# Patient Record
Sex: Male | Born: 1945 | Race: White | Hispanic: No | Marital: Married | Smoking: Former smoker
Health system: Southern US, Community
[De-identification: ages and names within clinical notes are randomized; demographics above are authoritative.]

## PROBLEM LIST (undated history)

## (undated) DIAGNOSIS — A809 Acute poliomyelitis, unspecified: Secondary | ICD-10-CM

## (undated) DIAGNOSIS — K219 Gastro-esophageal reflux disease without esophagitis: Secondary | ICD-10-CM

## (undated) DIAGNOSIS — IMO0001 Reserved for inherently not codable concepts without codable children: Secondary | ICD-10-CM

## (undated) DIAGNOSIS — C801 Malignant (primary) neoplasm, unspecified: Secondary | ICD-10-CM

---

## 2014-12-06 ENCOUNTER — Ambulatory Visit
Admission: RE | Admit: 2014-12-06 | Discharge: 2014-12-06 | Disposition: A | Discharge status: Home / Self Care | Payer: Self-pay | Source: Ambulatory Visit | Attending: Family Medicine | Admitting: Family Medicine

## 2014-12-06 ENCOUNTER — Other Ambulatory Visit: Payer: Self-pay | Admitting: Family Medicine

## 2014-12-06 DIAGNOSIS — R222 Localized swelling, mass and lump, trunk: Secondary | ICD-10-CM

## 2014-12-06 DIAGNOSIS — M7541 Impingement syndrome of right shoulder: Secondary | ICD-10-CM

## 2014-12-06 DIAGNOSIS — R52 Pain, unspecified: Secondary | ICD-10-CM

## 2014-12-11 ENCOUNTER — Other Ambulatory Visit: Payer: Medicare Other

## 2014-12-12 ENCOUNTER — Ambulatory Visit
Admission: RE | Admit: 2014-12-12 | Discharge: 2014-12-12 | Disposition: A | Discharge status: Home / Self Care | Payer: Medicare Other | Source: Ambulatory Visit | Attending: Family Medicine | Admitting: Family Medicine

## 2014-12-12 DIAGNOSIS — R222 Localized swelling, mass and lump, trunk: Secondary | ICD-10-CM

## 2014-12-12 MED ORDER — IOPAMIDOL (ISOVUE-300) INJECTION 61%
75.0000 mL | Freq: Once | INTRAVENOUS | Status: AC | PRN
  Administered 2014-12-12: 75 mL via INTRAVENOUS

## 2014-12-13 ENCOUNTER — Telehealth: Payer: Self-pay | Admitting: *Deleted

## 2014-12-13 ENCOUNTER — Encounter: Payer: Self-pay | Admitting: *Deleted

## 2014-12-13 DIAGNOSIS — R918 Other nonspecific abnormal finding of lung field: Secondary | ICD-10-CM

## 2014-12-13 NOTE — Telephone Encounter (Signed)
Called patient to schedule for thoracic clinic.  He is scheduled 12/20/2014 arrive at 1:30

## 2014-12-16 ENCOUNTER — Telehealth: Payer: Self-pay | Admitting: *Deleted

## 2014-12-16 NOTE — Telephone Encounter (Signed)
Called the referring office to get more records.  They asked that I call the son with appt.  I called and left a vm message regarding appt and my name and phone number to call with questions.

## 2014-12-17 ENCOUNTER — Encounter: Payer: Self-pay | Admitting: *Deleted

## 2014-12-19 ENCOUNTER — Other Ambulatory Visit (HOSPITAL_BASED_OUTPATIENT_CLINIC_OR_DEPARTMENT_OTHER): Payer: Medicare Other

## 2014-12-19 ENCOUNTER — Inpatient Hospital Stay (HOSPITAL_COMMUNITY): Payer: Medicare Other

## 2014-12-19 ENCOUNTER — Ambulatory Visit
Admission: RE | Admit: 2014-12-19 | Discharge: 2014-12-19 | Disposition: A | Discharge status: Home / Self Care | Payer: Medicare Other | Source: Ambulatory Visit | Attending: Radiation Oncology | Admitting: Radiation Oncology

## 2014-12-19 ENCOUNTER — Ambulatory Visit: Payer: Medicare Other | Admitting: Physical Therapy

## 2014-12-19 ENCOUNTER — Inpatient Hospital Stay (HOSPITAL_COMMUNITY)
Admission: AD | Admit: 2014-12-19 | Discharge: 2015-01-08 | DRG: 853 | Disposition: E | Payer: Medicare Other | Source: Ambulatory Visit | Attending: Internal Medicine | Admitting: Internal Medicine

## 2014-12-19 ENCOUNTER — Ambulatory Visit (HOSPITAL_BASED_OUTPATIENT_CLINIC_OR_DEPARTMENT_OTHER): Payer: Medicare Other | Admitting: Internal Medicine

## 2014-12-19 ENCOUNTER — Encounter: Payer: Self-pay | Admitting: *Deleted

## 2014-12-19 ENCOUNTER — Encounter (HOSPITAL_COMMUNITY): Payer: Self-pay

## 2014-12-19 VITALS — BP 108/66 | HR 99 | Temp 98.2°F | Resp 14

## 2014-12-19 DIAGNOSIS — R918 Other nonspecific abnormal finding of lung field: Secondary | ICD-10-CM

## 2014-12-19 DIAGNOSIS — R532 Functional quadriplegia: Secondary | ICD-10-CM | POA: Diagnosis present

## 2014-12-19 DIAGNOSIS — D72829 Elevated white blood cell count, unspecified: Secondary | ICD-10-CM | POA: Diagnosis present

## 2014-12-19 DIAGNOSIS — M898X9 Other specified disorders of bone, unspecified site: Secondary | ICD-10-CM

## 2014-12-19 DIAGNOSIS — C7951 Secondary malignant neoplasm of bone: Secondary | ICD-10-CM | POA: Diagnosis present

## 2014-12-19 DIAGNOSIS — R531 Weakness: Secondary | ICD-10-CM | POA: Diagnosis not present

## 2014-12-19 DIAGNOSIS — E870 Hyperosmolality and hypernatremia: Secondary | ICD-10-CM | POA: Diagnosis present

## 2014-12-19 DIAGNOSIS — E43 Unspecified severe protein-calorie malnutrition: Secondary | ICD-10-CM | POA: Diagnosis present

## 2014-12-19 DIAGNOSIS — R627 Adult failure to thrive: Secondary | ICD-10-CM | POA: Diagnosis present

## 2014-12-19 DIAGNOSIS — C3412 Malignant neoplasm of upper lobe, left bronchus or lung: Secondary | ICD-10-CM | POA: Diagnosis present

## 2014-12-19 DIAGNOSIS — A419 Sepsis, unspecified organism: Secondary | ICD-10-CM | POA: Diagnosis present

## 2014-12-19 DIAGNOSIS — E860 Dehydration: Secondary | ICD-10-CM | POA: Diagnosis present

## 2014-12-19 DIAGNOSIS — J9601 Acute respiratory failure with hypoxia: Secondary | ICD-10-CM | POA: Diagnosis present

## 2014-12-19 DIAGNOSIS — R0600 Dyspnea, unspecified: Secondary | ICD-10-CM | POA: Diagnosis present

## 2014-12-19 DIAGNOSIS — G9340 Encephalopathy, unspecified: Secondary | ICD-10-CM | POA: Diagnosis present

## 2014-12-19 DIAGNOSIS — J449 Chronic obstructive pulmonary disease, unspecified: Secondary | ICD-10-CM | POA: Diagnosis present

## 2014-12-19 DIAGNOSIS — K219 Gastro-esophageal reflux disease without esophagitis: Secondary | ICD-10-CM | POA: Diagnosis present

## 2014-12-19 DIAGNOSIS — C799 Secondary malignant neoplasm of unspecified site: Secondary | ICD-10-CM

## 2014-12-19 DIAGNOSIS — R5383 Other fatigue: Secondary | ICD-10-CM | POA: Diagnosis not present

## 2014-12-19 DIAGNOSIS — J9621 Acute and chronic respiratory failure with hypoxia: Secondary | ICD-10-CM | POA: Diagnosis present

## 2014-12-19 DIAGNOSIS — D75839 Thrombocytosis, unspecified: Secondary | ICD-10-CM | POA: Diagnosis present

## 2014-12-19 DIAGNOSIS — R319 Hematuria, unspecified: Secondary | ICD-10-CM

## 2014-12-19 DIAGNOSIS — F17220 Nicotine dependence, chewing tobacco, uncomplicated: Secondary | ICD-10-CM | POA: Diagnosis present

## 2014-12-19 DIAGNOSIS — N390 Urinary tract infection, site not specified: Secondary | ICD-10-CM | POA: Diagnosis present

## 2014-12-19 DIAGNOSIS — Z66 Do not resuscitate: Secondary | ICD-10-CM | POA: Diagnosis present

## 2014-12-19 DIAGNOSIS — D638 Anemia in other chronic diseases classified elsewhere: Secondary | ICD-10-CM | POA: Diagnosis present

## 2014-12-19 DIAGNOSIS — E876 Hypokalemia: Secondary | ICD-10-CM | POA: Diagnosis present

## 2014-12-19 DIAGNOSIS — I240 Acute coronary thrombosis not resulting in myocardial infarction: Secondary | ICD-10-CM | POA: Diagnosis present

## 2014-12-19 DIAGNOSIS — M25511 Pain in right shoulder: Secondary | ICD-10-CM

## 2014-12-19 DIAGNOSIS — D630 Anemia in neoplastic disease: Secondary | ICD-10-CM | POA: Diagnosis present

## 2014-12-19 DIAGNOSIS — G9520 Unspecified cord compression: Secondary | ICD-10-CM

## 2014-12-19 DIAGNOSIS — M899 Disorder of bone, unspecified: Secondary | ICD-10-CM

## 2014-12-19 DIAGNOSIS — Z8612 Personal history of poliomyelitis: Secondary | ICD-10-CM

## 2014-12-19 DIAGNOSIS — Z6823 Body mass index (BMI) 23.0-23.9, adult: Secondary | ICD-10-CM

## 2014-12-19 DIAGNOSIS — D473 Essential (hemorrhagic) thrombocythemia: Secondary | ICD-10-CM | POA: Diagnosis present

## 2014-12-19 DIAGNOSIS — Z515 Encounter for palliative care: Secondary | ICD-10-CM | POA: Diagnosis not present

## 2014-12-19 DIAGNOSIS — R109 Unspecified abdominal pain: Secondary | ICD-10-CM

## 2014-12-19 DIAGNOSIS — IMO0002 Reserved for concepts with insufficient information to code with codable children: Secondary | ICD-10-CM

## 2014-12-19 HISTORY — DX: Gastro-esophageal reflux disease without esophagitis: K21.9

## 2014-12-19 HISTORY — DX: Acute poliomyelitis, unspecified: A80.9

## 2014-12-19 HISTORY — DX: Reserved for inherently not codable concepts without codable children: IMO0001

## 2014-12-19 HISTORY — DX: Malignant (primary) neoplasm, unspecified: C80.1

## 2014-12-19 LAB — CBC WITH DIFFERENTIAL/PLATELET
BASO%: 0.3 % (ref 0.0–2.0)
BASOS ABS: 0.1 10*3/uL (ref 0.0–0.1)
Basophils Absolute: 0 10*3/uL (ref 0.0–0.1)
Basophils Relative: 0 % (ref 0–1)
EOS ABS: 0.6 10*3/uL — AB (ref 0.0–0.5)
EOS%: 1.4 % (ref 0.0–7.0)
Eosinophils Absolute: 0.4 10*3/uL (ref 0.0–0.7)
Eosinophils Relative: 1 % (ref 0–5)
HCT: 38.3 % — ABNORMAL LOW (ref 39.0–52.0)
HEMATOCRIT: 36.9 % — AB (ref 38.4–49.9)
HEMOGLOBIN: 11.9 g/dL — AB (ref 13.0–17.1)
Hemoglobin: 11.5 g/dL — ABNORMAL LOW (ref 13.0–17.0)
LYMPH#: 1.4 10*3/uL (ref 0.9–3.3)
LYMPH%: 3.5 % — ABNORMAL LOW (ref 14.0–49.0)
LYMPHS ABS: 1.8 10*3/uL (ref 0.7–4.0)
Lymphocytes Relative: 5 % — ABNORMAL LOW (ref 12–46)
MCH: 26.5 pg — ABNORMAL LOW (ref 27.2–33.4)
MCH: 26.1 pg (ref 26.0–34.0)
MCHC: 32.2 g/dL (ref 32.0–36.0)
MCHC: 30 g/dL (ref 30.0–36.0)
MCV: 82.4 fL (ref 79.3–98.0)
MCV: 86.8 fL (ref 78.0–100.0)
MONO#: 2.1 10*3/uL — AB (ref 0.1–0.9)
MONO%: 5.1 % (ref 0.0–14.0)
MONOS PCT: 6 % (ref 3–12)
Monocytes Absolute: 2.2 10*3/uL — ABNORMAL HIGH (ref 0.1–1.0)
NEUT#: 36.5 10*3/uL — ABNORMAL HIGH (ref 1.5–6.5)
NEUT%: 89.7 % — AB (ref 39.0–75.0)
NEUTROS ABS: 31.6 10*3/uL — AB (ref 1.7–7.7)
Neutrophils Relative %: 88 % — ABNORMAL HIGH (ref 43–77)
Platelets: 499 10*3/uL — ABNORMAL HIGH (ref 140–400)
Platelets: 401 10*3/uL — ABNORMAL HIGH (ref 150–400)
RBC: 4.48 10*6/uL (ref 4.20–5.82)
RBC: 4.41 MIL/uL (ref 4.22–5.81)
RDW: 14.6 % (ref 11.0–14.6)
RDW: 14.8 % (ref 11.5–15.5)
WBC: 40.7 10*3/uL — AB (ref 4.0–10.3)
WBC: 36 10*3/uL — AB (ref 4.0–10.5)

## 2014-12-19 LAB — URINALYSIS, ROUTINE W REFLEX MICROSCOPIC
BILIRUBIN URINE: NEGATIVE
Glucose, UA: NEGATIVE mg/dL
Ketones, ur: NEGATIVE mg/dL
Nitrite: NEGATIVE
PH: 6.5 (ref 5.0–8.0)
Protein, ur: 30 mg/dL — AB
SPECIFIC GRAVITY, URINE: 1.013 (ref 1.005–1.030)
Urobilinogen, UA: 1 mg/dL (ref 0.0–1.0)

## 2014-12-19 LAB — COMPREHENSIVE METABOLIC PANEL
ALT: 24 U/L (ref 17–63)
AST: 27 U/L (ref 15–41)
Albumin: 2.7 g/dL — ABNORMAL LOW (ref 3.5–5.0)
Alkaline Phosphatase: 115 U/L (ref 38–126)
Anion gap: 6 (ref 5–15)
BUN: 23 mg/dL — ABNORMAL HIGH (ref 6–20)
CO2: 36 mmol/L — AB (ref 22–32)
CREATININE: 0.59 mg/dL — AB (ref 0.61–1.24)
Calcium: 12.7 mg/dL — ABNORMAL HIGH (ref 8.9–10.3)
Chloride: 94 mmol/L — ABNORMAL LOW (ref 101–111)
GFR calc Af Amer: >60 mL/min (ref >60)
Glucose, Bld: 115 mg/dL — ABNORMAL HIGH (ref 65–99)
Potassium: 3.7 mmol/L (ref 3.5–5.1)
Sodium: 136 mmol/L (ref 135–145)
TOTAL PROTEIN: 7.2 g/dL (ref 6.5–8.1)
Total Bilirubin: 0.7 mg/dL (ref 0.3–1.2)

## 2014-12-19 LAB — COMPREHENSIVE METABOLIC PANEL (CC13)
ALT: 23 U/L (ref 0–55)
ANION GAP: 13 meq/L — AB (ref 3–11)
AST: 34 U/L (ref 5–34)
Albumin: 2.6 g/dL — ABNORMAL LOW (ref 3.5–5.0)
Alkaline Phosphatase: 136 U/L (ref 40–150)
BUN: 22.1 mg/dL (ref 7.0–26.0)
CHLORIDE: 93 meq/L — AB (ref 98–109)
CO2: 33 mEq/L — ABNORMAL HIGH (ref 22–29)
Calcium: 14.3 mg/dL (ref 8.4–10.4)
Creatinine: 0.7 mg/dL (ref 0.7–1.3)
EGFR: >90 mL/min/{1.73_m2} (ref >90)
GLUCOSE: 105 mg/dL (ref 70–140)
Potassium: 3.8 mEq/L (ref 3.5–5.1)
SODIUM: 140 meq/L (ref 136–145)
Total Bilirubin: 0.5 mg/dL (ref 0.20–1.20)
Total Protein: 7.5 g/dL (ref 6.4–8.3)

## 2014-12-19 LAB — RETICULOCYTES
RBC.: 4.41 MIL/uL (ref 4.22–5.81)
Retic Count, Absolute: 70.6 10*3/uL (ref 19.0–186.0)
Retic Ct Pct: 1.6 % (ref 0.4–3.1)

## 2014-12-19 LAB — URINE MICROSCOPIC-ADD ON

## 2014-12-19 LAB — TECHNOLOGIST REVIEW

## 2014-12-19 LAB — PHOSPHORUS: Phosphorus: 3 mg/dL (ref 2.5–4.6)

## 2014-12-19 LAB — APTT: aPTT: 34 seconds (ref 24–37)

## 2014-12-19 LAB — LACTIC ACID, PLASMA: LACTIC ACID, VENOUS: 1.2 mmol/L (ref 0.5–2.0)

## 2014-12-19 LAB — TSH: TSH: 0.565 u[IU]/mL (ref 0.350–4.500)

## 2014-12-19 LAB — HOLD TUBE, BLOOD BANK

## 2014-12-19 LAB — PROTIME-INR
INR: 1.14 (ref 0.00–1.49)
Prothrombin Time: 14.7 seconds (ref 11.6–15.2)

## 2014-12-19 LAB — MAGNESIUM: MAGNESIUM: 1.9 mg/dL (ref 1.7–2.4)

## 2014-12-19 MED ORDER — HYDROCODONE-ACETAMINOPHEN 5-325 MG PO TABS: 1.0000 | ORAL_TABLET | ORAL | Status: DC | PRN

## 2014-12-19 MED ORDER — GUAIFENESIN-DM 100-10 MG/5ML PO SYRP: 5.0000 mL | ORAL_SOLUTION | ORAL | Status: DC | PRN

## 2014-12-19 MED ORDER — MORPHINE SULFATE 2 MG/ML IJ SOLN
1.0000 mg | INTRAMUSCULAR | Status: DC | PRN
  Administered 2014-12-19 – 2014-12-20 (×2): 1 mg via INTRAVENOUS
  Administered 2014-12-20: 0.5 mg via INTRAVENOUS
  Filled 2014-12-19 (×3): qty 1

## 2014-12-19 MED ORDER — ALBUTEROL SULFATE (2.5 MG/3ML) 0.083% IN NEBU: 2.5000 mg | INHALATION_SOLUTION | RESPIRATORY_TRACT | Status: DC | PRN

## 2014-12-19 MED ORDER — LORAZEPAM 2 MG/ML IJ SOLN
0.5000 mg | Freq: Four times a day (QID) | INTRAMUSCULAR | Status: DC | PRN
  Administered 2014-12-19 – 2014-12-20 (×2): 0.5 mg via INTRAVENOUS
  Administered 2014-12-20 – 2014-12-22 (×6): 1 mg via INTRAVENOUS
  Filled 2014-12-19 (×8): qty 1

## 2014-12-19 MED ORDER — ONDANSETRON HCL 4 MG PO TABS: 4.0000 mg | ORAL_TABLET | Freq: Four times a day (QID) | ORAL | Status: DC | PRN

## 2014-12-19 MED ORDER — GUAIFENESIN ER 600 MG PO TB12
600.0000 mg | ORAL_TABLET | Freq: Two times a day (BID) | ORAL | Status: DC
  Filled 2014-12-19 (×9): qty 1

## 2014-12-19 MED ORDER — ALBUTEROL SULFATE (2.5 MG/3ML) 0.083% IN NEBU
2.5000 mg | INHALATION_SOLUTION | Freq: Four times a day (QID) | RESPIRATORY_TRACT | Status: DC
Start: 2014-12-19 — End: 2014-12-20
  Administered 2014-12-19: 2.5 mg via RESPIRATORY_TRACT
  Filled 2014-12-19: qty 3

## 2014-12-19 MED ORDER — SODIUM CHLORIDE 0.9 % IV SOLN
INTRAVENOUS | Status: DC
  Administered 2014-12-19 – 2014-12-20 (×2): 1000 mL via INTRAVENOUS

## 2014-12-19 MED ORDER — ONDANSETRON HCL 4 MG/2ML IJ SOLN: 4.0000 mg | Freq: Four times a day (QID) | INTRAMUSCULAR | Status: DC | PRN

## 2014-12-19 MED ORDER — CEFTRIAXONE SODIUM IN DEXTROSE 20 MG/ML IV SOLN
1.0000 g | INTRAVENOUS | Status: DC
  Administered 2014-12-19 – 2014-12-22 (×4): 1 g via INTRAVENOUS
  Filled 2014-12-19 (×4): qty 50

## 2014-12-19 MED ORDER — ZOLEDRONIC ACID 4 MG/5ML IV CONC
4.0000 mg | Freq: Once | INTRAVENOUS | Status: AC
  Administered 2014-12-19: 4 mg via INTRAVENOUS
  Filled 2014-12-19: qty 5

## 2014-12-19 MED ORDER — ENOXAPARIN SODIUM 40 MG/0.4ML ~~LOC~~ SOLN
40.0000 mg | SUBCUTANEOUS | Status: DC
  Filled 2014-12-19 (×2): qty 0.4

## 2014-12-19 NOTE — H&P (Signed)
[facility] History and Physical  Samuel Fritz EPP:[id number] DOB: 14-Mar-1946 DOA: 12/30/2014  Referring physician: Transfer from Dr. Worthy Flank office  PCP: Rachell Cipro, MD   Chief Complaint: [place]   HPI:  Patient is 69 year old male who has not seen a physician for extended period of time, apparently diagnosed with lung mass week prior to this admission and referred to Dr. Julien Nordmann for further evaluation. Please note that patient is too weak to provide any history at the time of the admission and wife at bedside able to assist with more details. She explains that patient had an appointment with Dr. Julien Nordmann earlier today prior to this admission where he was noted to be severely weak, had difficulty completing sentences, dyspnea at rest and with exertion noted with oxygen saturation in mid 80s on room air. Blood work was notable for WBC 40 K, calcium 14.3. Dr. Julien Nordmann asked to admit patient for further evaluation and management. Per wife, patient has been progressively declining over the past 4 months with > 20 pounds weight loss. Wife has also noted intermittent episodes of confusion, lethargy, poor oral intake. Patient has been consistently complaining to his wife about right shoulder pain, appeared to be constant, 10 out of 10 in severity, radiating to upper and lower back area, worse with movement, no specific alleviating factors, associated with right arm weakness and wife reports noticing that patient has not been moving his right upper extremity over the past week. Wife denies noticing any fevers or chills, no specific abdominal or urinary concerns reported. In the oncologist office, urine with blood noted. TRH asked to admit for further evaluation and management.  Assessment and Plan:  Principal Problem:   Weakness - Appears to be multifactorial and secondary to progressive failure to thrive and deconditioning in the setting of what appears to be metastatic  malignancy - More detailed workup in progress outlined below - Once patient more clinically stable we'll need PT/OT evaluation - We will provide supportive care with analgesia as needed to ensure comfort Active Problems:   Lung mass, secondary to lung cancer  - Workup in progress, plan for biopsy of the right scapular lesion - Appreciate oncology, radiation oncology, interventional radiology teams for assistance in management of this complicated medical condition   Right shoulder pain - Appears to be secondary to metastatic lung cancer - Biopsy of the scapular lesion planned as noted above - Radiation oncologist consulted as well for consideration of palliative radiation - Provide analgesia as needed   Leukocytosis - Appears to be reactive and secondary to underlying metastatic lung cancer - Urinalysis also suggestive of UTI with large leukocytes present - Placed on empiric Rocephin for now - Will obtain blood culture, urine culture - Check lactic acid   Hypercalcemia - Likely secondary to malignancy, IV Zometa requested as recommended by oncologist - also provide IVF and repeat BMP tonight and again in AM   Severe protein-calorie malnutrition - In the setting of illness outlined above - Significant weight loss also related to progression of the metastatic lung cancer - We will advance diet as patient able to tolerate - Nutrition is consulted   ? Cord compression - Significant weakness in right upper extremity, recent CT chest on 12/12/2014 noted worrisome possibility of cord compression given metastatic spread - We'll proceed with MRI of thoracic spine for further evaluation - Radiation oncologist consulted, may need Decadron   Acute encephalopathy - Possibly secondary to metastatic spread of lung cancer - MRI of the brain  requested for further evaluation   Acute respiratory failure with hypoxia - Secondary to lung cancer with likely underlying COPD (pt with long history of  smoking but apparently quit few months ago) - Please note the chest x-ray does not mention COPD however, image notable for hyperinflated lungs with flattening of the diaphragms consistent with stigmata of rather advanced COPD - Keep on oxygen via nasal cannula for now, provide bronchodilators scheduled and as needed   Thrombocytosis - Appears to be reactive, repeat CBC in the morning   Anemia of chronic disease - Anemia of malignancy, no signs of active bleeding, repeat CBC in the morning   DVT prophylaxis - Lovenox SQ   Radiological Exams on Admission: Dg Chest 2 View  12/14/2014   1. Stable findings of recently diagnosed metastatic lung cancer.  2. No acute superimposed process identified.     CT chest 12/12/2014 1. Large centrally necrotic left upper lobe lung mass with multiple bilateral pulmonary nodules and osseous metastases consistent with stage IV lung cancer. 2. Most prominent osseous metastases are within the right scapular body and T8 vertebral body. The latter is associated with epidural tumor, placing the patient at risk for cord compression in the future, not currently demonstrated.    Code Status: Full Family Communication: Pt and wife at bedside Disposition Plan: Admit for further evaluation    [facility] [phone]   Review of Systems:  Constitutional: Negative for diaphoresis.  HENT: Negative for hearing loss, ear pain, neck pain, tinnitus and ear discharge.   Eyes: Negative for blurred vision, double vision, photophobia, pain, discharge and redness.  Respiratory: Negative for hemoptysis and stridor.   Cardiovascular: Negative for  claudication and leg swelling.  Gastrointestinal: Negative for nausea, vomiting and abdominal pain.  Genitourinary: Negative for dysuria, urgency, and flank pain.  Musculoskeletal: Negative for myalgias, and falls.  Skin: Negative for itching and rash.  Neurological: Negative for dizziness Endo/Heme/Allergies: Negative for  environmental allergies and polydipsia. Does not bruise/bleed easily.  Psychiatric/Behavioral: Negative for suicidal ideas.     Past Medical History  Diagnosis Date  . Polio   . GERD (gastroesophageal reflux disease)   . Shortness of breath dyspnea   . Cancer     Lung cancer   Pt denies any surgical history   Social History:  reports that he has quit smoking. His smoking use included Cigarettes. His smokeless tobacco use includes Chew. He reports that he does not drink alcohol or use illicit drugs.  Pt reports no family history of cancers.  Prior to Admission medications   Medication Sig Start Date End Date Taking? Authorizing Provider  ENSURE (ENSURE) Take 1 Can by mouth 2 (two) times daily between meals.    Historical Provider, MD  morphine 10 MG/5ML solution Take 2.5 mg by mouth every 2 (two) hours as needed for severe pain (0.53m-0.5ml q2hrs per family).    Historical Provider, MD  traMADol (Veatrice Bourbon 50 MG tablet  12/14/14   Historical Provider, MD    Physical Exam: There were no vitals filed for this visit.  Physical Exam  Constitutional: Appears tired and chronically ill, frail and cachectic HENT: Normocephalic. External right and left ear normal. Dry MM Eyes: Conjunctivae and EOM are normal. PERRLA, no scleral icterus.  Neck: Normal ROM. Neck supple. No JVD. No tracheal deviation. No thyromegaly.  CVS: RRR, S1/S2 +, no murmurs, no gallops, no carotid bruit.  Pulmonary: Diminished breath sounds bilaterally with expiratory wheezing noted in upper lobes, mild bronchitis at bases  Abdominal: Soft. BS +,  no distension, tenderness, rebound or guarding.  Musculoskeletal: Normal range of motion. significant tenderness in thoracic and lumbar spine and paraspinal area bilaterally Lymphadenopathy: No lymphadenopathy noted, cervical, inguinal. Neuro: Alert. No cranial nerve deficit. Skin: Skin is warm and dry. No rash noted. Not diaphoretic. No erythema. No pallor.  Psychiatric:  Tired appearing, flat affect   Labs on Admission:  Basic Metabolic Panel:  Recent Labs Lab 12/30/2014 1347  NA 140  K 3.8  CO2 33*  GLUCOSE 105  BUN 22.1  CREATININE 0.7  CALCIUM 14.3*   Liver Function Tests:  Recent Labs Lab 12/31/2014 1347  AST 34  ALT 23  ALKPHOS 136  BILITOT 0.50  PROT 7.5  ALBUMIN 2.6*   CBC:  Recent Labs Lab 12/17/2014 1347  WBC 40.7*  NEUTROABS 36.5*  HGB 11.9*  HCT 36.9*  MCV 82.4  PLT 499*    EKG: Pending    If 7PM-7AM, please contact night-coverage [email or web address] Password [email or web address] 12/12/2014, 4:35 PM

## 2014-12-19 NOTE — Progress Notes (Signed)
Received a phone call from Dominica at [facility]. States pt was enrolled in their services last week. Requesting admitting diagnosis and updates. [phone].

## 2014-12-19 NOTE — Progress Notes (Signed)
[place] [phone]   [phone] Multidisciplinary thoracic oncology clinic  CONSULT NOTE  REFERRING PHYSICIAN: Dr. Rachell Cipro  REASON FOR CONSULTATION:  69 years old white male with questionable lung cancer.  HPI Samuel Fritz is a 69 y.o. male with no significant past medical history but long history of smoking and quit 4 years ago. The patient mentions that, weeks ago he presented to his primary care physician complaining of right shoulder pain as well as cough and shortness of breath. He had x-ray of the thoracic spines as well as right shoulder and it showed no evidence of thoracic spine fracture or other acute findings but there was a large masslike opacity in the left upper lobe consistent with bronchogenic carcinoma. Chest x-ray on 12/06/2014 showed 9.6 cm left upper lobe mass anteriorly with adjacent 3.5 cm lingular mass. The appearance was highly concerning for malignancy. CT scan of the chest with contrast on 12/12/2014 showed large centrally necrotic left upper lobe lung mass measuring 7.9 x 6.9 cm. There are several satellite lesions primarily within the left upper lobe measuring 2.1 cm and 4.2 x 2.8 cm. The latter lesion crosses the major fissure and extends into the lower lobe. This is suspicious of transpleural spread of the dominant left upper lobe lesion onto the chest wall. There is osseous metastases and the most prominent are within the right scapular body and T8 vertebral body. The latter is associated with epidural tumor placing the patient at risk for cord compression. The patient was referred to the multidisciplinary thoracic oncology clinic today for further evaluation and recommendation regarding his condition. When seen today he is very weak and sitting in a wheelchair. Has been complaining of lack of appetite, poor by mouth intake and dehydration. He also has constipation and was treated with an enema few days ago. He has  increased urine frequency with hematuria started yesterday. The patient also had significant weight loss recently. He denied having any fever or chills. He has no nausea or vomiting. He continues to have right-sided chest pain with shortness breath with exertion but no cough or hemoptysis. He has no headache or visual changes. Family history significant for a mother with throat cancer. Father had heart attack. Brother with brain cancer, another brother with lip cancer and prostate cancer. The patient is married and has 2 children. He was accompanied today by his wife, Samuel Fritz, his son, Samuel Fritz. and his daughter Samuel Fritz. The patient used to work in [employer]. He has a history of smoking more than one pack per day for over 50 years and quit 4 years ago. He also recently switch it to chewing tobacco. He has no history of alcohol or drug abuse. HPI  Past Medical History  Diagnosis Date  . Hypertension     No past surgical history on file.  No family history on file.  Social History History  Substance Use Topics  . Smoking status: Current Every Day Smoker  . Smokeless tobacco: Not on file  . Alcohol Use: No    Allergies not on file  Current Outpatient Prescriptions  Medication Sig Dispense Refill  . ENSURE (ENSURE) Take 1 Can by mouth 2 (two) times daily between meals.    Marland Kitchen morphine 10 MG/5ML solution Take 2.5 mg by mouth every 2 (two) hours as needed for severe pain (0.61m-0.5ml q2hrs per family).    . traMADol (ULTRAM) 50 MG tablet   0   No current facility-administered medications  for this visit.    Review of Systems  Constitutional: positive for anorexia, fatigue and weight loss Eyes: negative Ears, nose, mouth, throat, and face: negative Respiratory: positive for dyspnea on exertion and pleurisy/chest pain Cardiovascular: negative Gastrointestinal: positive for constipation Genitourinary:positive for frequency, hematuria and urinary incontinence Integument/breast:  negative Hematologic/lymphatic: negative Musculoskeletal:positive for bone pain and muscle weakness Neurological: positive for coordination problems, dizziness, memory problems and weakness Behavioral/Psych: negative Endocrine: negative Allergic/Immunologic: negative  Physical Exam  GMW:NUUVOZDGU, ill looking, malnourished and pale SKIN: skin color, texture, turgor are normal HEAD: Normocephalic, No masses, lesions, tenderness or abnormalities EYES: normal, PERRLA EARS: External ears normal, Canals clear OROPHARYNX:no exudate, no erythema and lips, buccal mucosa, and tongue normal  NECK: supple, no adenopathy, no JVD LYMPH:  no palpable lymphadenopathy, no hepatosplenomegaly BREAST:not examined LUNGS: clear to auscultation , and palpation HEART: regular rate & rhythm, no murmurs and no gallops ABDOMEN:abdomen soft, non-tender, normal bowel sounds and no masses or organomegaly BACK: Back symmetric, no curvature., No CVA tenderness EXTREMITIES:no joint deformities, effusion, or inflammation, no edema, no skin discoloration  NEURO: alert & oriented x 3 with fluent speech, no focal motor/sensory deficits  PERFORMANCE STATUS: ECOG 2  LABORATORY DATA: Lab Results  Component Value Date   WBC 40.7* 01/02/2015   HGB 11.9* 12/16/2014   HCT 36.9* 12/31/2014   MCV 82.4 12/15/2014   PLT 499* 12/16/2014      Chemistry      Component Value Date/Time   NA 140 12/22/2014 1347   K 3.8 12/27/2014 1347   CO2 33* 12/16/2014 1347   BUN 22.1 12/08/2014 1347   CREATININE 0.7 12/11/2014 1347      Component Value Date/Time   CALCIUM 14.3* 12/30/2014 1347   ALKPHOS 136 01/03/2015 1347   AST 34 12/18/2014 1347   ALT 23 01/04/2015 1347   BILITOT 0.50 01/01/2015 1347       RADIOGRAPHIC STUDIES: Dg Chest 2 View  12/06/2014   CLINICAL DATA:  Chest pain.  Cough.  Shortness of breath.  EXAM: CHEST  2 VIEW  COMPARISON:  None.  FINDINGS: 9.2 by 9.6 cm left mid lung mass appears to be  anteriorly located in the left upper lobe. Adjacent lingular 3.5 by 2.7 cm mass. Both of these may above the pleural margin, but I do not see obvious bony destruction.  Atherosclerotic aortic arch. Upper normal heart size. The right lung appears clear. Thoracic spondylosis is present.  IMPRESSION: 1. 9.6 cm left upper lobe mass anteriorly. Adjacent 3.5 cm lingular mass. Appearance highly concerning for malignancy. Chest CT (with contrast if feasible) is recommended. These results will be called to the ordering clinician or representative by the Radiologist Assistant, and communication documented in the PACS or zVision Dashboard.   Electronically Signed   By: Van Clines M.D.   On: 12/06/2014 16:42   Dg Thoracic Spine W/swimmers  12/06/2014   CLINICAL DATA:  Chronic thoracic back pain.  No known injury.  EXAM: THORACIC SPINE - 2 VIEW + SWIMMERS  COMPARISON:  None.  FINDINGS: There is no evidence of thoracic spine fracture. Alignment is normal. Thoracic syndesmophyte formation seen with preservation of intervertebral disc spaces, consistent with diffuse idiopathic skeletal hyperostosis. No focal lytic or sclerotic bone lesions identified.  Enlarged masslike opacity is seen in the left upper lobe, suspicious for bronchogenic carcinoma.  IMPRESSION: No evidence of thoracic spine fracture or other acute findings. Diffuse idiopathic skeletal hyperostosis noted.  Large masslike opacity in the left upper lobe, consistent with  bronchogenic carcinoma. Chest CT with contrast recommended for further evaluation.   Electronically Signed   By: Earle Gell M.D.   On: 12/06/2014 16:42   Dg Lumbar Spine Complete  12/06/2014   CLINICAL DATA:  Chronic low back pain.  No known injury.  EXAM: LUMBAR SPINE - COMPLETE 4+ VIEW  COMPARISON:  None.  FINDINGS: Moderate rightward thoracolumbar scoliosis centered at the T12 level. Diffuse degenerative spurring throughout the lumbar spine. Diffuse degenerative facet disease. No  fracture.  IMPRESSION: Rightward scoliosis with diffuse degenerative disc and facet disease. No acute findings.   Electronically Signed   By: Rolm Baptise M.D.   On: 12/06/2014 16:41   Dg Shoulder Right  12/06/2014   CLINICAL DATA:  Right shoulder pain. Right shoulder impingement. Limited range of motion.  EXAM: RIGHT SHOULDER - 2+ VIEW  COMPARISON:  None.  FINDINGS: There is no evidence of fracture or dislocation. Moderate acromioclavicular degenerative spurring and sclerosis is seen. Glenohumeral joint is not well profiled on this exam. Generalized osteopenia noted. No other bone abnormality identified.  IMPRESSION: No acute findings.  Moderate acromioclavicular DJD.   Electronically Signed   By: Earle Gell M.D.   On: 12/06/2014 16:40   Ct Chest W Contrast  12/12/2014   CLINICAL DATA:  Former smoker with left lung mass on radiographs no history of malignancy. Initial encounter.  EXAM: CT CHEST WITH CONTRAST  TECHNIQUE: Multidetector CT imaging of the chest was performed during intravenous contrast administration.  CONTRAST:  75 ml Isovue-300.  COMPARISON:  Chest radiographs 12/06/2014.  FINDINGS: Mediastinum/Nodes: There is a 1.5 cm AP window node on image 24. There are probable enlarged left hilar lymph nodes, not optimally evaluated due to limited contrast bolus. No other enlarged mediastinal lymph nodes demonstrated. The thyroid gland, trachea and esophagus demonstrate no significant findings. The heart size is normal. There is no pericardial effusion.There is diffuse atherosclerosis of the aorta, great vessels and coronary arteries.  Lungs/Pleura: There is no pleural effusion. As demonstrated radiographically, there is a large centrally necrotic left upper lobe mass, measuring 7.9 x 6.9 cm transverse. There are several satellite lesions, primarily within the left upper lobe, measuring 2.1 cm on image 24, 1.7 cm on image 29 and 4.2 x 2.8 cm on image 34. This latter lesion crosses the major fissure and  extends into the lower lobe. There is suspicion of transpleural spread of the dominant left upper lobe lesion into the chest wall. There is no adjacent rib destruction. Multiple other smaller pulmonary nodules are present bilaterally. The largest nodule in the right lung is in the lower lobe, measuring 8 mm on image 30. Moderate diffuse changes of centrilobular emphysema noted.  Upper abdomen:  Unremarkable.  There is no adrenal mass.  Musculoskeletal/Chest wall: There is a large lytic lesion involving the right scapular body, with adjacent soft tissue components measuring up to 6.5 cm on image 13. There is a large lytic lesion which nearly completely replaces the T8 vertebral body. There is mild associated posterior epidural tumor and superior extension into the inferior aspect of the left T7 vertebral body. There is a possible small lytic metastasis within the C7 vertebral body.  IMPRESSION: 1. Large centrally necrotic left upper lobe lung mass with multiple bilateral pulmonary nodules and osseous metastases consistent with stage IV lung cancer. 2. Most prominent osseous metastases are within the right scapular body and T8 vertebral body. The latter is associated with epidural tumor, placing the patient at risk for cord compression in  the future, not currently demonstrated. 3. These results will be called to the ordering clinician or representative by the Radiology Department at the imaging location.   Electronically Signed   By: Richardean Sale M.D.   On: 12/12/2014 13:26    ASSESSMENT: This is a very pleasant 69 years old white male with highly suspicious metastatic lung cancer pending tissue diagnosis and staging workup. The patient presented with large lesions in the left upper lobe as well as satellite lesions and metastatic disease in the right scapular area. His blood work today showed significant leukocytosis concerning for sepsis as well as hypercalcemia of malignancy. The patient is so weak and  confused   PLAN: I had a lengthy discussion with the patient and his family today about his current disease status, further investigation to confirm his diagnosis as well as treatment options. I explained to the patient and his family that he will need to biopsy of the right scapular area for confirmation of his tissue diagnosis. We will also arrange for the patient to have MRI of the brain as well as CT scan of the abdomen and pelvis for staging of his disease. For the confusion and hypercalcemia of malignancy, the patient will need IV hydration as well as treatment with Zometa. He will be admitted to [facility] for further evaluation and management of his condition. For the leukocytosis and questionable sepsis, the patient will have urinalysis as well as culture sensitivity and blood cultures and treatment with antibiotics once he is hospitalized. For the questionable cord compression, the patient will need to start treatment with dexamethasone and has MRI of the thoracic spine to rule out cord compression. He may also need evaluation by radiation oncology. I will continue to follow up the patient during his hospitalization and provide further recommendation as needed. The patient and his family agreed to the current plan. The patient voices understanding of current disease status and treatment options and is in agreement with the current care plan.  All questions were answered. The patient knows to call the clinic with any problems, questions or concerns. We can certainly see the patient much sooner if necessary.  Thank you so much for allowing me to participate in the care of Samuel Fritz. I will continue to follow up the patient with you and assist in his care.  I spent 40 minutes counseling the patient face to face. The total time spent in the appointment was 60 minutes.  Disclaimer: This note was dictated with voice recognition software. Similar sounding words can inadvertently  be transcribed and may not be corrected upon review.   Adisa Litt K. Dec 19, 2014, 2:47 PM

## 2014-12-19 NOTE — Progress Notes (Signed)
Samuel Fritz Clinical Social Work  Clinical Social Work met with patient/family at [employer] appointment to offer support and assess for psychosocial needs.  [name] was accompanied by several family members today.  Medical oncologist met with patient prior to CSW and determined patient would need to be admitted.  Patient's son shared patient is currently enrolled in [facility], [place] encouraged patient's family to keep in communication with Hospice through patient's hospital admission.  Clinical Social Work briefly discussed Clinical Social Work role and [employer] support programs/services.  Clinical Social Work encouraged patient to call with any additional questions or concerns.   Polo Riley, MSW, LCSW, OSW-C Clinical Social Worker [facility] [phone]

## 2014-12-19 NOTE — Progress Notes (Signed)
Patient ID: Samuel Fritz, male   DOB: 1946/02/08, 69 y.o.   MRN: [id number] Request received for image guided biopsy on patient. Patient's case was discussed in tumor board with Dr. Earlie Server and Dr. Barbie Banner. Decision made to perform biopsy of the right scapula lesion. Will assess /tent schedule for procedure on 5/13.

## 2014-12-19 NOTE — Progress Notes (Signed)
Spoke with patient today during thoracic clinic.  Patient is unable to hold his head up or complete a sentence.  Dr. Julien Nordmann is aware of abnormal lab work.  Dr. Julien Nordmann called hospital ist and patient was admitted.  I introduced myself to the family.  I received FMLA papers and they were given to [place].

## 2014-12-20 ENCOUNTER — Inpatient Hospital Stay (HOSPITAL_COMMUNITY): Payer: Medicare Other

## 2014-12-20 ENCOUNTER — Encounter: Payer: Self-pay | Admitting: *Deleted

## 2014-12-20 ENCOUNTER — Encounter: Payer: Self-pay | Admitting: Internal Medicine

## 2014-12-20 DIAGNOSIS — C7951 Secondary malignant neoplasm of bone: Secondary | ICD-10-CM | POA: Insufficient documentation

## 2014-12-20 DIAGNOSIS — R5383 Other fatigue: Secondary | ICD-10-CM

## 2014-12-20 DIAGNOSIS — E860 Dehydration: Secondary | ICD-10-CM

## 2014-12-20 DIAGNOSIS — D72829 Elevated white blood cell count, unspecified: Secondary | ICD-10-CM

## 2014-12-20 DIAGNOSIS — R531 Weakness: Secondary | ICD-10-CM

## 2014-12-20 DIAGNOSIS — R930 Abnormal findings on diagnostic imaging of skull and head, not elsewhere classified: Secondary | ICD-10-CM

## 2014-12-20 LAB — BASIC METABOLIC PANEL
Anion gap: 11 (ref 5–15)
BUN: 21 mg/dL — ABNORMAL HIGH (ref 6–20)
CO2: 34 mmol/L — ABNORMAL HIGH (ref 22–32)
Calcium: 13 mg/dL — ABNORMAL HIGH (ref 8.9–10.3)
Chloride: 95 mmol/L — ABNORMAL LOW (ref 101–111)
Creatinine, Ser: 0.6 mg/dL — ABNORMAL LOW (ref 0.61–1.24)
GFR calc Af Amer: >60 mL/min (ref >60)
Glucose, Bld: 110 mg/dL — ABNORMAL HIGH (ref 65–99)
POTASSIUM: 3.2 mmol/L — AB (ref 3.5–5.1)
SODIUM: 140 mmol/L (ref 135–145)

## 2014-12-20 LAB — IRON AND TIBC
Iron: 23 ug/dL — ABNORMAL LOW (ref 45–182)
Saturation Ratios: 12 % — ABNORMAL LOW (ref 17.9–39.5)
TIBC: 197 ug/dL — ABNORMAL LOW (ref 250–450)
UIBC: 174 ug/dL

## 2014-12-20 LAB — CBC
HEMATOCRIT: 38.9 % — AB (ref 39.0–52.0)
Hemoglobin: 12 g/dL — ABNORMAL LOW (ref 13.0–17.0)
MCH: 26.8 pg (ref 26.0–34.0)
MCHC: 30.8 g/dL (ref 30.0–36.0)
MCV: 86.8 fL (ref 78.0–100.0)
Platelets: 317 10*3/uL (ref 150–400)
RBC: 4.48 MIL/uL (ref 4.22–5.81)
RDW: 14.8 % (ref 11.5–15.5)
WBC: 32.7 10*3/uL — ABNORMAL HIGH (ref 4.0–10.5)

## 2014-12-20 LAB — URINE CULTURE
COLONY COUNT: NO GROWTH
Culture: NO GROWTH

## 2014-12-20 LAB — VITAMIN B12: Vitamin B-12: 290 pg/mL (ref 180–914)

## 2014-12-20 LAB — FOLATE

## 2014-12-20 LAB — FERRITIN: Ferritin: 1094 ng/mL — ABNORMAL HIGH (ref 24–336)

## 2014-12-20 MED ORDER — MIDAZOLAM HCL 2 MG/2ML IJ SOLN
INTRAMUSCULAR | Status: AC | PRN
  Administered 2014-12-20: 0.5 mg via INTRAVENOUS

## 2014-12-20 MED ORDER — ENOXAPARIN SODIUM 80 MG/0.8ML ~~LOC~~ SOLN
70.0000 mg | Freq: Two times a day (BID) | SUBCUTANEOUS | Status: DC
  Administered 2014-12-21 – 2014-12-23 (×5): 70 mg via SUBCUTANEOUS
  Filled 2014-12-20 (×9): qty 0.8

## 2014-12-20 MED ORDER — ALBUTEROL SULFATE (2.5 MG/3ML) 0.083% IN NEBU: 2.5000 mg | INHALATION_SOLUTION | Freq: Four times a day (QID) | RESPIRATORY_TRACT | Status: DC | PRN

## 2014-12-20 MED ORDER — IPRATROPIUM-ALBUTEROL 0.5-2.5 (3) MG/3ML IN SOLN
3.0000 mL | Freq: Three times a day (TID) | RESPIRATORY_TRACT | Status: DC
  Administered 2014-12-20 – 2014-12-23 (×10): 3 mL via RESPIRATORY_TRACT
  Filled 2014-12-20 (×10): qty 3

## 2014-12-20 MED ORDER — IOHEXOL 300 MG/ML  SOLN
100.0000 mL | Freq: Once | INTRAMUSCULAR | Status: AC | PRN
  Administered 2014-12-20: 100 mL via INTRAVENOUS

## 2014-12-20 MED ORDER — DEXAMETHASONE SODIUM PHOSPHATE 4 MG/ML IJ SOLN
4.0000 mg | Freq: Four times a day (QID) | INTRAMUSCULAR | Status: DC
  Administered 2014-12-20 – 2014-12-23 (×12): 4 mg via INTRAVENOUS
  Filled 2014-12-20 (×16): qty 1

## 2014-12-20 MED ORDER — MORPHINE SULFATE 2 MG/ML IJ SOLN
2.0000 mg | INTRAMUSCULAR | Status: DC | PRN
  Administered 2014-12-20: 4 mg via INTRAVENOUS
  Administered 2014-12-20: 2 mg via INTRAVENOUS
  Administered 2014-12-20: 4 mg via INTRAVENOUS
  Administered 2014-12-21 (×7): 2 mg via INTRAVENOUS
  Administered 2014-12-22 – 2014-12-23 (×12): 4 mg via INTRAVENOUS
  Filled 2014-12-20 (×2): qty 2
  Filled 2014-12-20: qty 1
  Filled 2014-12-20 (×3): qty 2
  Filled 2014-12-20: qty 1
  Filled 2014-12-20: qty 2
  Filled 2014-12-20 (×2): qty 1
  Filled 2014-12-20: qty 2
  Filled 2014-12-20: qty 1
  Filled 2014-12-20 (×6): qty 2
  Filled 2014-12-20 (×2): qty 1
  Filled 2014-12-20: qty 2
  Filled 2014-12-20: qty 1
  Filled 2014-12-20: qty 2

## 2014-12-20 MED ORDER — FENTANYL CITRATE (PF) 100 MCG/2ML IJ SOLN
INTRAMUSCULAR | Status: AC
  Filled 2014-12-20: qty 4

## 2014-12-20 MED ORDER — MIDAZOLAM HCL 2 MG/2ML IJ SOLN
INTRAMUSCULAR | Status: AC
  Filled 2014-12-20: qty 4

## 2014-12-20 MED ORDER — LORAZEPAM 2 MG/ML IJ SOLN
0.5000 mg | Freq: Once | INTRAMUSCULAR | Status: AC
  Administered 2014-12-20: 0.5 mg via INTRAVENOUS
  Filled 2014-12-20: qty 1

## 2014-12-20 MED ORDER — ALBUTEROL SULFATE (2.5 MG/3ML) 0.083% IN NEBU
2.5000 mg | INHALATION_SOLUTION | RESPIRATORY_TRACT | Status: DC | PRN
  Administered 2014-12-23 – 2014-12-24 (×2): 2.5 mg via RESPIRATORY_TRACT
  Filled 2014-12-20 (×2): qty 3

## 2014-12-20 MED ORDER — FENTANYL CITRATE (PF) 100 MCG/2ML IJ SOLN
INTRAMUSCULAR | Status: AC | PRN
  Administered 2014-12-20: 25 ug via INTRAVENOUS

## 2014-12-20 MED ORDER — KCL IN DEXTROSE-NACL 40-5-0.9 MEQ/L-%-% IV SOLN
INTRAVENOUS | Status: DC
  Administered 2014-12-20 (×2): via INTRAVENOUS
  Filled 2014-12-20 (×2): qty 1000

## 2014-12-20 NOTE — Progress Notes (Signed)
Rocephin completing as lab drawing blood cultures. Did not realize that cultures had not already been sent. ua had been.

## 2014-12-20 NOTE — Progress Notes (Signed)
ANTICOAGULATION CONSULT NOTE - Initial Consult  Pharmacy Consult for lovenox Indication: mural thrombus  Not on File  Patient Measurements:   Vital Signs: Temp: 98.9 F (37.2 C) (05/13 1405) Temp Source: Oral (05/13 1405) BP: 155/75 mmHg (05/13 1405) Pulse Rate: 90 (05/13 1405)  Labs:  Recent Labs  12/31/2014 1347 01/07/2015 1347 01/07/2015 2055 12/20/14 0325  HGB 11.9*  --  11.5* 12.0*  HCT 36.9*  --  38.3* 38.9*  PLT 499*  --  401* 317  APTT  --   --  34  --   LABPROT  --   --  14.7  --   INR  --   --  1.14  --   CREATININE  --  0.7 0.59* 0.60*    CrCl cannot be calculated (Unknown ideal weight.).   Medical History: Past Medical History  Diagnosis Date  . Polio   . GERD (gastroesophageal reflux disease)   . Shortness of breath dyspnea   . Cancer     Lung cancer    Medications:  See med rec  Assessment: Patient's a 69 y.o M with lung cancer and suspected metastatic disease who was admitted on 5/12 with weakness and shortness of breath. S/p right scapular lesion biopsy on 5/13.  CT abd pelvis on 5/13 showed mural thrombus in the distal abdominal aorta.  To start lovenox for thrombus.  Per Dr. Doyle Askew, will start anticoagulation at 0800 on 5/14 due to recent biopsy.  Goal of Therapy:  Anti-Xa level 0.6-1 units/ml 4hrs after LMWH dose given Monitor platelets by anticoagulation protocol: Yes   Plan:  - lovenox 70 mg SQ q12h (to start on 5/14 at 0800) - cbc q72h  Endy Easterly P 12/20/2014,4:51 PM

## 2014-12-20 NOTE — Procedures (Signed)
Technically successful CT guided biopsy of infiltrative right scapular mass.  No immediate post procedural complications.

## 2014-12-20 NOTE — Care Management Note (Signed)
Case Management Note  Patient Details  Name: Samuel Fritz MRN: [id number] Date of Birth: 10-Jun-1946  Subjective/Objective:    69 yo admitted with weakness. Recent diagnosis of CA                Action/Plan: From home with spouse and [facility].  Expected Discharge Date:   (unknown)               Expected Discharge Plan:  Home w Hospice Care  In-House Referral:     Discharge planning Services  CM Consult  Post Acute Care Choice:    Choice offered to:     DME Arranged:    DME Agency:     HH Arranged:    HH Agency:     Status of Service:  In process, will continue to follow  Medicare Important Message Given:    Date Medicare IM Given:    Medicare IM give by:    Date Additional Medicare IM Given:    Additional Medicare Important Message give by:     If discussed at [place] of Stay Meetings, dates discussed:    Additional Comments: Chart reviewed and CM following for DC needs.  Lynnell Catalan, RN 12/20/2014, 12:28 PM

## 2014-12-20 NOTE — Progress Notes (Addendum)
Patient ID: Samuel Fritz, male   DOB: 26-Dec-1945, 69 y.o.   MRN: [id number]  [facility] PROGRESS NOTE  Samuel Fritz YQI:[id number] DOB: Jan 01, 1946 DOA: 12/28/2014 PCP: Rachell Cipro, MD   Brief narrative:    Patient is 69 year old male who has not seen a physician for extended period of time, apparently diagnosed with lung mass week prior to this admission and referred to Dr. Julien Nordmann for further evaluation. Please note that patient is too weak to provide any history at the time of the admission and wife at bedside able to assist with more details. She explains that patient had an appointment with Dr. Julien Nordmann earlier today prior to this admission where he was noted to be severely weak, had difficulty completing sentences, dyspnea at rest and with exertion noted with oxygen saturation in mid 80s on room air. Blood work was notable for WBC 40 K, calcium 14.3. Dr. Julien Nordmann asked to admit patient for further evaluation and management. Per wife, patient has been progressively declining over the past 4 months with > 20 pounds weight loss. Wife has also noted intermittent episodes of confusion, lethargy, poor oral intake. Patient has been consistently complaining to his wife about right shoulder pain, appeared to be constant, 10 out of 10 in severity, radiating to upper and lower back area, worse with movement, no specific alleviating factors, associated with right arm weakness and wife reports noticing that patient has not been moving his right upper extremity over the past week. Wife denies noticing any fevers or chills, no specific abdominal or urinary concerns reported. In the oncologist office, urine with blood noted. [facility] asked to admit for further evaluation and management.  Assessment/Plan:    Principal Problem:  Weakness - Appears to be multifactorial and secondary to progressive failure to thrive and deconditioning in the setting of what appears to be metastatic malignancy - More detailed  workup in progress outlined below - Once patient more clinically stable we'll need PT/OT evaluation - We will continue to provide supportive care with analgesia as needed to ensure comfort Active Problems:  Lung mass, secondary to ? lung cancer  - Workup in progress, pt underwent CT guided biopsy of the right scapular lesion 5/13 - CT abd and pelvis with no signs of metastatic disease but mural thrombus noted, management as noted below  - Appreciate oncology, radiation oncology, interventional radiology teams for assistance in management of this complicated medical condition - I spent time discussing with family current diagnosis and difficulty in obtaining imaging studies unless pt transferred to [facility] to have MRI done under sedation with general anesthesia, I explained that there is a possibility that pt may not be able to come off life support  - family to discuss this more among each other and with himself, will hold off on further evaluation until I hear from family    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 Sepsis - VS mostly secondary to acute respiratory failure in the setting of metastatic lung cancer rather than an infectious source  - also underlying UTI and acute illness metastatic lung cancer - Urinalysis suggestive of UTI with large leukocytes present - Placed on empiric Rocephin for now, will continue Rocephin day #2 - follow upon  urine cultures, blood cultures   Hypercalcemia - Likely secondary to malignancy, IV Zometa requested as recommended by oncologist - pt has gotten one dose on admission  - continue to provide IVF   Severe protein-calorie malnutrition - In the setting of illness outlined  above - Significant weight loss also related to progression of the metastatic lung cancer - advance diet if pt able to tolerate  - Nutrition specialist consulted  ? Cord compression - Significant weakness in right upper extremity, recent CT chest on 12/12/2014 noted worrisome possibility of cord compression given metastatic spread - pt unable to tolerate MRI for now  - Radiation oncologist consulted, started Decadron IV for now    Hypokalemia - supplement and repeat BMP in AM  Acute encephalopathy - Possibly secondary to metastatic spread of lung cancer, hypercalcemia, dehydration  - MRI of the brain requested for further evaluation but unable to tolerate and will likely need to be sedated for this  - will await for family decision in terms of how aggressive they want to be with further evaluation   Acute respiratory failure with hypoxia - Secondary to lung cancer with likely underlying COPD (pt with long history of smoking but apparently quit few years ago) - Please note the chest x-ray does not mention COPD however, image notable for hyperinflated lungs with flattening of the diaphragms consistent with stigmata of rather advanced COPD - there is a possibility of underlying PE but pt currently unable to proceed with further testing, wants to hold off until tomorrow - Lovenox started per pharmacy for mural thrombus noted in distal abd aorta  - Keep on oxygen via nasal cannula for now, provide bronchodilators scheduled and as needed   Acute functional quadriplegia - in the setting of advanced malignancy with metastatic spread  - unable to get out of the bed, needs two people assistance at minimum   Thrombocytosis - Appears to be reactive, resolved - repeat CBC in the morning  Anemia of chronic disease - Anemia of malignancy, no signs of active bleeding, repeat CBC in the morning  DVT prophylaxis - Lovenox to be dosed per pharmacy   Code Status: DNR Family Communication: Pt and  wife at bedside Disposition Plan: Cancer work up in progress  IV access:  Peripheral IV  Procedures and diagnostic studies:    CXR 12/08/2014  Stable findings of recently diagnosed metastatic lung cancer. 2. No acute superimposed process   Ct Chest W Contrast   12/12/2014   1. Large centrally necrotic left upper lobe lung mass with multiple bilateral pulmonary nodules and osseous metastases consistent with stage IV lung cancer. 2. Most prominent osseous metastases are within the right scapular body and T8 vertebral body. The latter is associated with epidural tumor, placing the patient at risk for cord compression in future, not demonstrated.  Ct Abdomen Pelvis W Contrast  12/20/2014  1. No evidence of metastatic disease within abdomen or pelvis. 2. Again noted lytic lesion replacing T8 vertebral body. 3. Extensive atherosclerotic plaques and calcifications within abdominal aorta and bilateral common iliac arteries. Mural thrombus is noted in distal abdominal aorta. 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 4. Abundant stool within cecum. No pericecal inflammation. Normal appendix. 5. No small bowel or colonic obstruction. 6. No hydronephrosis or hydroureter. There is a cyst in lower pole anterior aspect of the left kidney measures 2.7 cm. 7. Degenerative changes lumbar spine and  bilateral SI joints.     Ct Biopsy  12/20/2014  Technically successful ultrasound and CT guided core needle biopsy of infiltrative right scapular mass. Note, approximately 50 cc of bloody fluid was aspirated from the mass. All aspirated fluid was capped and sent to the laboratory for cytologic analysis.     Medical Consultants:  Interventional radiology Radiation oncology Oncology   Other Consultants:  None  IAnti-Infectives:   Rocephin 5/12 -->  Faye Ramsay, MD  [facility] Pager [phone]  If 7PM-7AM,  please contact night-coverage [email or web address] Password [email or web address] 12/20/2014, 4:30 PM   LOS: 1 day   HPI/Subjective: No events overnight. Significant discomfort and dyspnea at rest and with exertion.   Objective: Filed Vitals:   12/20/14 1151 12/20/14 1154 12/20/14 1321 12/20/14 1405  BP: 140/85 147/82  155/75  Pulse: 114 115  90  Temp:    98.9 F (37.2 C)  TempSrc:    Oral  Resp: '28 18  21  '$ SpO2: 98% 98% 99%     Intake/Output Summary (Last 24 hours) at 12/20/14 1630 Last data filed at 12/20/14 0700  Gross per 24 hour  Intake   1470 ml  Output   2000 ml  Net   -530 ml    Exam:   General:  Pt is somnolent, confused, in mild distress due to dyspnea   Cardiovascular: Regular rhythm,tachycardic, no rubs, no gallops  Respiratory: Course breath sounds bilaterally with exp wheezing and rhonchi   Abdomen: Soft, non tender, non distended, bowel sounds present, no guarding  Extremities: pulses DP and PT palpable bilaterally  Neuro: RUE strength 1/5 and LUE strength 4/5, LE strength 2/5 bilaterally, sensation diminished in upper and lower extremities bilaterally   Data Reviewed: Basic Metabolic Panel:  Recent Labs Lab 12/23/2014 1347 12/28/2014 2055 12/20/14 0325  NA 140 136 140  K 3.8 3.7 3.2*  CL  --  94* 95*  CO2 33* 36* 34*  GLUCOSE 105 115* 110*  BUN 22.1 23* 21*  CREATININE 0.7 0.59* 0.60*  CALCIUM 14.3* 12.7* 13.0*  MG  --  1.9  --   PHOS  --  3.0  --    Liver Function Tests:  Recent Labs Lab 12/09/2014 1347 12/22/2014 2055  AST 34 27  ALT 23 24  ALKPHOS 136 115  BILITOT 0.50 0.7  PROT 7.5 7.2  ALBUMIN 2.6* 2.7*   CBC:  Recent Labs Lab 01/06/2015 1347 01/01/2015 2055 12/20/14 0325  WBC 40.7* 36.0* 32.7*  NEUTROABS 36.5* 31.6*  --   HGB 11.9* 11.5* 12.0*  HCT 36.9* 38.3* 38.9*  MCV 82.4 86.8 86.8  PLT 499* 401* 317   Recent Results (from the past 240 hour(s))  TECHNOLOGIST REVIEW     Status: None   Collection Time: 01/05/2015  1:47 PM  Result Value  Ref Range Status   Technologist Review Occassional Metas and Myelocytes present  Final    Scheduled Meds: . cefTRIAXone (ROCEPHIN)  IV  1 g Intravenous Q24H  . dexamethasone  4 mg Intravenous 4 times per day  . enoxaparin (LOVENOX) injection  40 mg Subcutaneous Q24H  . guaiFENesin  600 mg Oral BID  . ipratropium-albuterol  3 mL Nebulization TID   Continuous Infusions: . sodium chloride 1,000 mL (12/20/14 0621)

## 2014-12-20 NOTE — Plan of Care (Signed)
Problem: Phase I Progression Outcomes Goal: Pain controlled with appropriate interventions Outcome: Progressing Pain in right shoulder. Prn morphine does decrease pain

## 2014-12-20 NOTE — Consult Note (Cosign Needed)
Chief Complaint: Lung Mass  Referring Physician(s): Dr. Mart Piggs  History of Present Illness: Samuel Fritz is a 69 y.o. male who was found to have lung masses about a week ago.  Pt is very weak and has also been combative. Family provides history and history obtained from chart review as well. We are asked to evaluate patient for a CT guided biopsy of a right scapula mass.  Past Medical History  Diagnosis Date  . Polio   . GERD (gastroesophageal reflux disease)   . Shortness of breath dyspnea   . Cancer     Lung cancer    History reviewed. No pertinent past surgical history.  Allergies: Review of patient's allergies indicates not on file.  Medications: Prior to Admission medications   Medication Sig Start Date End Date Taking? Authorizing Provider  ENSURE (ENSURE) Take 1 Can by mouth 2 (two) times daily between meals.   Yes Historical Provider, MD  morphine 10 MG/5ML solution Take 2.5 mg by mouth every 2 (two) hours as needed for severe pain (0.82m-0.5ml q2hrs per family).   Yes Historical Provider, MD     History reviewed. No pertinent family history.  History   Social History  . Marital Status: Married    Spouse Name: N/A  . Number of Children: N/A  . Years of Education: N/A   Social History Main Topics  . Smoking status: Former Smoker    Types: Cigarettes  . Smokeless tobacco: Current User    Types: Chew  . Alcohol Use: No  . Drug Use: No  . Sexual Activity: No   Other Topics Concern  . None   Social History Narrative     Unable to obtain ROS from patient as he is confused and combative at times.  Review of Systems  Vital Signs: BP 138/69 mmHg  Pulse 90  Temp(Src) 98.8 F (37.1 C) (Oral)  Resp 18  SpO2 93%  Physical Exam   Well Developed NAD.  Patient is Awake but not oriented. Does follow some commands. Neck supple with normal ROM Right arm is weak with decreased ROM. Lungs Clear anteriorly, breath sound diminished some on  the right, but no wheezes. Heart RRR.  Mallampati Score:   1  Imaging: Dg Chest 2 View  01/05/2015   CLINICAL DATA:  Cough, shortness of breath and chest pain. Lung cancer. Initial encounter.  EXAM: CHEST  2 VIEW  COMPARISON:  Radiographs 12/06/2014.  CT 12/12/2014.  FINDINGS: The dominant left upper lobe mass and adjacent satellite lesions are grossly stable. No superimposed airspace disease, significant pleural effusion or pneumothorax identified. Underlying chronic obstructive pulmonary disease noted. The heart size and mediastinal contours are stable. The known right scapular and T8 vertebral body metastases are not well visualized.  IMPRESSION: 1. Stable findings of recently diagnosed metastatic lung cancer. 2. No acute superimposed process identified.   Electronically Signed   By: WRichardean SaleM.D.   On: 12/09/2014 17:51   Dg Chest 2 View  12/06/2014   CLINICAL DATA:  Chest pain.  Cough.  Shortness of breath.  EXAM: CHEST  2 VIEW  COMPARISON:  None.  FINDINGS: 9.2 by 9.6 cm left mid lung mass appears to be anteriorly located in the left upper lobe. Adjacent lingular 3.5 by 2.7 cm mass. Both of these may above the pleural margin, but I do not see obvious bony destruction.  Atherosclerotic aortic arch. Upper normal heart size. The right lung appears clear. Thoracic spondylosis is present.  IMPRESSION: 1. 9.6 cm left upper lobe mass anteriorly. Adjacent 3.5 cm lingular mass. Appearance highly concerning for malignancy. Chest CT (with contrast if feasible) is recommended. These results will be called to the ordering clinician or representative by the Radiologist Assistant, and communication documented in the PACS or zVision Dashboard.   Electronically Signed   By: Van Clines M.D.   On: 12/06/2014 16:42   Dg Thoracic Spine W/swimmers  12/06/2014   CLINICAL DATA:  Chronic thoracic back pain.  No known injury.  EXAM: THORACIC SPINE - 2 VIEW + SWIMMERS  COMPARISON:  None.  FINDINGS: There is  no evidence of thoracic spine fracture. Alignment is normal. Thoracic syndesmophyte formation seen with preservation of intervertebral disc spaces, consistent with diffuse idiopathic skeletal hyperostosis. No focal lytic or sclerotic bone lesions identified.  Enlarged masslike opacity is seen in the left upper lobe, suspicious for bronchogenic carcinoma.  IMPRESSION: No evidence of thoracic spine fracture or other acute findings. Diffuse idiopathic skeletal hyperostosis noted.  Large masslike opacity in the left upper lobe, consistent with bronchogenic carcinoma. Chest CT with contrast recommended for further evaluation.   Electronically Signed   By: Earle Gell M.D.   On: 12/06/2014 16:42   Dg Lumbar Spine Complete  12/06/2014   CLINICAL DATA:  Chronic low back pain.  No known injury.  EXAM: LUMBAR SPINE - COMPLETE 4+ VIEW  COMPARISON:  None.  FINDINGS: Moderate rightward thoracolumbar scoliosis centered at the T12 level. Diffuse degenerative spurring throughout the lumbar spine. Diffuse degenerative facet disease. No fracture.  IMPRESSION: Rightward scoliosis with diffuse degenerative disc and facet disease. No acute findings.   Electronically Signed   By: Rolm Baptise M.D.   On: 12/06/2014 16:41   Dg Shoulder Right  12/06/2014   CLINICAL DATA:  Right shoulder pain. Right shoulder impingement. Limited range of motion.  EXAM: RIGHT SHOULDER - 2+ VIEW  COMPARISON:  None.  FINDINGS: There is no evidence of fracture or dislocation. Moderate acromioclavicular degenerative spurring and sclerosis is seen. Glenohumeral joint is not well profiled on this exam. Generalized osteopenia noted. No other bone abnormality identified.  IMPRESSION: No acute findings.  Moderate acromioclavicular DJD.   Electronically Signed   By: Earle Gell M.D.   On: 12/06/2014 16:40   Ct Chest W Contrast  12/12/2014   CLINICAL DATA:  Former smoker with left lung mass on radiographs no history of malignancy. Initial encounter.  EXAM: CT  CHEST WITH CONTRAST  TECHNIQUE: Multidetector CT imaging of the chest was performed during intravenous contrast administration.  CONTRAST:  75 ml Isovue-300.  COMPARISON:  Chest radiographs 12/06/2014.  FINDINGS: Mediastinum/Nodes: There is a 1.5 cm AP window node on image 24. There are probable enlarged left hilar lymph nodes, not optimally evaluated due to limited contrast bolus. No other enlarged mediastinal lymph nodes demonstrated. The thyroid gland, trachea and esophagus demonstrate no significant findings. The heart size is normal. There is no pericardial effusion.There is diffuse atherosclerosis of the aorta, great vessels and coronary arteries.  Lungs/Pleura: There is no pleural effusion. As demonstrated radiographically, there is a large centrally necrotic left upper lobe mass, measuring 7.9 x 6.9 cm transverse. There are several satellite lesions, primarily within the left upper lobe, measuring 2.1 cm on image 24, 1.7 cm on image 29 and 4.2 x 2.8 cm on image 34. This latter lesion crosses the major fissure and extends into the lower lobe. There is suspicion of transpleural spread of the dominant left upper lobe lesion into the  chest wall. There is no adjacent rib destruction. Multiple other smaller pulmonary nodules are present bilaterally. The largest nodule in the right lung is in the lower lobe, measuring 8 mm on image 30. Moderate diffuse changes of centrilobular emphysema noted.  Upper abdomen:  Unremarkable.  There is no adrenal mass.  Musculoskeletal/Chest wall: There is a large lytic lesion involving the right scapular body, with adjacent soft tissue components measuring up to 6.5 cm on image 13. There is a large lytic lesion which nearly completely replaces the T8 vertebral body. There is mild associated posterior epidural tumor and superior extension into the inferior aspect of the left T7 vertebral body. There is a possible small lytic metastasis within the C7 vertebral body.  IMPRESSION: 1.  Large centrally necrotic left upper lobe lung mass with multiple bilateral pulmonary nodules and osseous metastases consistent with stage IV lung cancer. 2. Most prominent osseous metastases are within the right scapular body and T8 vertebral body. The latter is associated with epidural tumor, placing the patient at risk for cord compression in the future, not currently demonstrated. 3. These results will be called to the ordering clinician or representative by the Radiology Department at the imaging location.   Electronically Signed   By: Richardean Sale M.D.   On: 12/12/2014 13:26    Labs:  CBC:  Recent Labs  01/04/2015 1347 12/10/2014 2055 12/20/14 0325  WBC 40.7* 36.0* 32.7*  HGB 11.9* 11.5* 12.0*  HCT 36.9* 38.3* 38.9*  PLT 499* 401* 317    COAGS:  Recent Labs  12/28/2014 2055  INR 1.14  APTT 34    BMP:  Recent Labs  12/17/2014 1347 12/19/2014 2055 12/20/14 0325  NA 140 136 140  K 3.8 3.7 3.2*  CL  --  94* 95*  CO2 33* 36* 34*  GLUCOSE 105 115* 110*  BUN 22.1 23* 21*  CALCIUM 14.3* 12.7* 13.0*  CREATININE 0.7 0.59* 0.60*  GFRNONAA  --  >60 >60  GFRAA  --  >60 >60    LIVER FUNCTION TESTS:  Recent Labs  12/18/2014 1347 12/14/2014 2055  BILITOT 0.50 0.7  AST 34 27  ALT 23 24  ALKPHOS 136 115  PROT 7.5 7.2  ALBUMIN 2.6* 2.7*    TUMOR MARKERS: No results for input(s): AFPTM, CEA, CA199, CHROMGRNA in the last 8760 hours.  Assessment and Plan:  Lung Masses Right Scapular Mass Will proceed with CT guided biopsy of the Right Scapula Mass by Dr. Pascal Lux.  Risks and Benefits discussed with the patient and family including, but not limited to bleeding, infection, damage to adjacent structures or low yield requiring additional tests.  All of the patient's/family's questions were answered, patient and family is agreeable to proceed.  Consent signed and in chart.   Thank you for this interesting consult.  I greatly enjoyed meeting Samuel Fritz and look forward to  participating in their care.  SignedNevin Bloodgood 12/20/2014, 9:00 AM   I spent a total of 20 in face to face in clinical consultation, greater than 50% of which was counseling/coordinating care for CT guided Scapula Biopsy

## 2014-12-20 NOTE — [facility] Oncology Navigator Note (Unsigned)
I saw patient yesterday at thoracic clinic.  He was fatigued and confused.  Dr. Julien Nordmann had patient admitted to the hospital.  I went to see Samuel Fritz at [facility] today.  He has his family a the bedside.  He seemed bright eyed but still not making clear sentences.  According to Dr. Julien Nordmann, patient will be set up for scan and be followed by hospitalist.  Samuel Fritz has great support with him and they were updated on plan from Dr. Julien Nordmann.

## 2014-12-20 NOTE — Progress Notes (Signed)
Subjective: The patient is seen and examined today. His wife was at the bedside. He is still complaining of increasing fatigue and weakness and confusion at time. He is scheduled for CT biopsy on the right scapular mass by interventional radiology later today. The patient is also has orders for MRI of the brain as well as thoracic spines. He denied having any fever or chills. He has no nausea or vomiting.  Objective: Vital signs in last 24 hours: Temp:  [98.2 F (36.8 C)-98.8 F (37.1 C)] 98.8 F (37.1 C) (05/12 2141) Pulse Rate:  [86-99] 90 (05/13 0619) Resp:  [14-18] 18 (05/13 0619) BP: (108-138)/(66-72) 138/69 mmHg (05/13 0619) SpO2:  [87 %-97 %] 93 % (05/13 0734)  Intake/Output from previous day: 05/12 0701 - 05/13 0700 In: -  Out: 1000 [Urine:1000] Intake/Output this shift:    General appearance: alert, cooperative, fatigued, no distress and slowed mentation Resp: clear to auscultation bilaterally Cardio: regular rate and rhythm, S1, S2 normal, no murmur, click, rub or gallop GI: soft, non-tender; bowel sounds normal; no masses,  no organomegaly Extremities: extremities normal, atraumatic, no cyanosis or edema  Lab Results:   Recent Labs  12/20/2014 2055 12/20/14 0325  WBC 36.0* 32.7*  HGB 11.5* 12.0*  HCT 38.3* 38.9*  PLT 401* 317   BMET  Recent Labs  12/29/2014 2055 12/20/14 0325  NA 136 140  K 3.7 3.2*  CL 94* 95*  CO2 36* 34*  GLUCOSE 115* 110*  BUN 23* 21*  CREATININE 0.59* 0.60*  CALCIUM 12.7* 13.0*    Studies/Results: Dg Chest 2 View  01/04/2015   CLINICAL DATA:  Cough, shortness of breath and chest pain. Lung cancer. Initial encounter.  EXAM: CHEST  2 VIEW  COMPARISON:  Radiographs 12/06/2014.  CT 12/12/2014.  FINDINGS: The dominant left upper lobe mass and adjacent satellite lesions are grossly stable. No superimposed airspace disease, significant pleural effusion or pneumothorax identified. Underlying chronic obstructive pulmonary disease noted. The  heart size and mediastinal contours are stable. The known right scapular and T8 vertebral body metastases are not well visualized.  IMPRESSION: 1. Stable findings of recently diagnosed metastatic lung cancer. 2. No acute superimposed process identified.   Electronically Signed   By: Richardean Sale M.D.   On: 01/02/2015 17:51    Medications: I have reviewed the patient's current medications.   Assessment/Plan: This is a very pleasant 69 years old white male with questionable metastatic lung cancer who was admitted with significant weakness and fatigue as well as lack of appetite, dehydration as well as hypercalcemia of malignancy and leukocytosis concerning for sepsis. Blood cultures are still pending. 1) hypercalcemia of malignancy: The patient received 1 dose of Zometa yesterday. Continue IV hydration. 2) leukocytosis: Questionable sepsis. Blood culture are still pending. Continue Rocephin. 3) questionable metastatic lung cancer: I agree with proceeding with the biopsy of the right scapular lesion. The patient will need staging scan including MRI of the brain as well as CT scan of the abdomen and pelvis. We will discuss treatment options based on the final pathology and staging workup.  4) pain management: He may benefit from palliative radiotherapy to the painful scapular lesion. Continue no and morphine for now. 5) DVT prophylaxis: Continue Lovenox. Thank you so much for taking good care of Mr. Novinger, I will continue to follow up the patient with you and assist in his management on as-needed basis.    LOS: 1 day    Mahki Spikes K. 12/20/2014

## 2014-12-21 LAB — BASIC METABOLIC PANEL
ANION GAP: 11 (ref 5–15)
BUN: 28 mg/dL — ABNORMAL HIGH (ref 6–20)
CALCIUM: 11.7 mg/dL — AB (ref 8.9–10.3)
CHLORIDE: 104 mmol/L (ref 101–111)
CO2: 32 mmol/L (ref 22–32)
Creatinine, Ser: 0.67 mg/dL (ref 0.61–1.24)
GFR calc Af Amer: >60 mL/min (ref >60)
GFR calc non Af Amer: >60 mL/min (ref >60)
GLUCOSE: 143 mg/dL — AB (ref 65–99)
Potassium: 3.6 mmol/L (ref 3.5–5.1)
Sodium: 147 mmol/L — ABNORMAL HIGH (ref 135–145)

## 2014-12-21 LAB — CBC
HCT: 39 % (ref 39.0–52.0)
HEMOGLOBIN: 11.8 g/dL — AB (ref 13.0–17.0)
MCH: 26.7 pg (ref 26.0–34.0)
MCHC: 30.3 g/dL (ref 30.0–36.0)
MCV: 88.2 fL (ref 78.0–100.0)
Platelets: 407 10*3/uL — ABNORMAL HIGH (ref 150–400)
RBC: 4.42 MIL/uL (ref 4.22–5.81)
RDW: 15.1 % (ref 11.5–15.5)
WBC: 37.3 10*3/uL — ABNORMAL HIGH (ref 4.0–10.5)

## 2014-12-21 NOTE — Progress Notes (Signed)
Patient ID: Samuel Fritz, male   DOB: 1946/01/28, 69 y.o.   MRN: [id number]  [facility] PROGRESS NOTE  [name] GXQ:[id number] DOB: Jan 23, 1946 DOA: 12/26/2014 PCP: Rachell Cipro, MD   Brief narrative:    Patient is 69 year old male who has not seen a physician for extended period of time, apparently diagnosed with lung mass week prior to this admission and referred to Dr. Julien Nordmann for further evaluation. Please note that patient is too weak to provide any history at the time of the admission and wife at bedside able to assist with more details. She explains that patient had an appointment with Dr. Julien Nordmann earlier today prior to this admission where he was noted to be severely weak, had difficulty completing sentences, dyspnea at rest and with exertion noted with oxygen saturation in mid 80s on room air. Blood work was notable for WBC 40 K, calcium 14.3. Dr. Julien Nordmann asked to admit patient for further evaluation and management. Per wife, patient has been progressively declining over the past 4 months with > 20 pounds weight loss. Wife has also noted intermittent episodes of confusion, lethargy, poor oral intake. Patient has been consistently complaining to his wife about right shoulder pain, appeared to be constant, 10 out of 10 in severity, radiating to upper and lower back area, worse with movement, no specific alleviating factors, associated with right arm weakness and wife reports noticing that patient has not been moving his right upper extremity over the past week. Wife denies noticing any fevers or chills, no specific abdominal or urinary concerns reported. In the oncologist office, urine with blood noted. [facility] asked to admit for further evaluation and management.  Assessment/Plan:    Principal Problem:  Weakness - Appears to be multifactorial and secondary to progressive failure to thrive and deconditioning in the setting of what appears to be metastatic malignancy - More detailed  workup in progress outlined below - Once patient more clinically stable we'll need PT/OT evaluation - continue to provide supportive care with analgesia as needed to ensure comfort Active Problems:  Lung mass, secondary to ? 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 Lisbeth Renshaw assistance greatly  - Provide analgesia as needed - plan for simulation on Monday   ? Sepsis - VS mostly secondary to acute respiratory failure in the setting of metastatic lung cancer rather than an infectious source  - also underlying UTI and acute illness metastatic lung cancer - Urinalysis suggestive of UTI with large leukocytes present - Placed on empiric Rocephin, continue day #3 until urine culture is back  - WBC still elevated but likely related to malignant process rather than an infectious etiology   Hypercalcemia - Likely secondary to malignancy, IV Zometa requested as recommended by oncologist - pt responding to IVF, Ca is trending down  - continue to provide IVF   Severe protein-calorie malnutrition - In the  setting of illness outlined above - Significant weight loss also related to progression of the metastatic lung cancer - advance diet if pt able to tolerate  - Nutrition specialist consulted and assistance is appreciated   High risk  for Cord compression - Significant weakness in right upper extremity, recent CT chest on 12/12/2014 noted worrisome possibility of cord compression given metastatic spread - pt unable to tolerate MRI for now  - Radiation oncologist consulted, continue Decadron IV for now  - plan to proceed with simulation on Monday    Hypokalemia - supplemented and WNL this AM   Hypernatremia, acute - secondary to pre renal etiology - crackles on exam preclude use of more aggressive IVF hydration - monitor daily weights, I/O  Acute encephalopathy - Possibly secondary to metastatic spread of lung cancer, hypercalcemia, dehydration  - MRI of the brain requested for further evaluation, motion degraded exam, no clear evidence of tumor spread  - better this AM   Acute respiratory failure with hypoxia - Secondary to lung cancer with likely underlying COPD (pt with long history of smoking but apparently quit few years ago) - Please note the chest x-ray does not mention COPD however, image notable for hyperinflated lungs with flattening of the diaphragms consistent with stigmata of rather advanced COPD - there is a possibility of underlying PE but pt currently unable to proceed with further testing - pt also with mild crackles on exam and likely from IVF, will lower the rate and give lasix if needed  - Lovenox started per pharmacy for mural thrombus noted in distal abd aorta  - Keep on oxygen via nasal cannula for now, provide bronchodilators scheduled and as needed   Acute functional quadriplegia - in the setting of advanced malignancy with metastatic spread in pt with underlying polio  - unable to get out of the bed, needs two people assistance at minimum  - will eventually need PT once more medically stable   Thrombocytosis - Appears to be reactive - repeat CBC in the morning  Anemia of chronic disease, IDA and malignancy  - iron level 23 - Anemia of malignancy, no signs of active bleeding, repeat CBC  in the morning - limited PO intake at this time, will start oral supplementation upon discharge   DVT prophylaxis - Lovenox to be dosed per pharmacy   Code Status: DNR Family Communication: Pt and wife at bedside Disposition Plan: Cancer work up in progress  IV access:  Peripheral IV  Procedures and diagnostic studies:    CXR 12/22/2014  Stable findings of recently diagnosed metastatic lung cancer. 2. No acute superimposed process   Ct Chest W Contrast   12/12/2014   1. Large centrally necrotic left upper lobe lung mass with multiple bilateral pulmonary nodules and osseous metastases consistent with stage IV lung cancer. 2. Most prominent osseous metastases are within the right scapular body and T8 vertebral body. The latter is associated with epidural tumor, placing the patient at risk for cord compression in future, not demonstrated.  Ct Abdomen Pelvis W Contrast  12/20/2014  1. No evidence of metastatic disease within abdomen or pelvis. 2. Again noted lytic lesion replacing T8 vertebral body. 3. Extensive atherosclerotic plaques and calcifications within abdominal aorta and bilateral common iliac arteries. Mural thrombus is noted in distal abdominal aorta. 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 4. Abundant stool within cecum. No pericecal inflammation. Normal appendix.  5. No small bowel or colonic obstruction. 6. No hydronephrosis or hydroureter. There is a cyst in lower pole anterior aspect of the left kidney measures 2.7 cm. 7. Degenerative changes lumbar spine and bilateral SI joints.     Ct Biopsy  12/20/2014  Technically successful ultrasound and CT guided core needle biopsy of infiltrative right scapular mass. Note, approximately 50 cc of bloody fluid was aspirated from the mass. All aspirated fluid was capped and sent to the laboratory for cytologic analysis.     Medical  Consultants:  Interventional radiology Radiation oncology Oncology   Other Consultants:  None  IAnti-Infectives:   Rocephin 5/12 -->  Faye Ramsay, MD  [facility] Pager [phone]  If 7PM-7AM, please contact night-coverage [email or web address] Password [facility] 12/21/2014, 11:39 AM   LOS: 2 days   HPI/Subjective: No events overnight. Significant discomfort and dyspnea at rest and with exertion.   Objective: Filed Vitals:   12/20/14 1654 12/21/14 0614 12/21/14 0617 12/21/14 0851  BP:  144/88    Pulse:  83    Temp:  97.4 F (36.3 C)    TempSrc:  Axillary    Resp:  22    Height: '5\' 8"'$  (1.727 m)     Weight: 69.854 kg (154 lb)  69.899 kg (154 lb 1.6 oz)   SpO2:  93%  91%    Intake/Output Summary (Last 24 hours) at 12/21/14 1139 Last data filed at 12/21/14 0617  Gross per 24 hour  Intake 654.17 ml  Output    550 ml  Net 104.17 ml    Exam:   General:  Pt is more alert this AM but remains weak overall   Cardiovascular: Regular rhythm,tachycardic, no rubs, no gallops  Respiratory: Course breath sounds bilaterally with exp wheezing and rhonchi, crackles at bases   Abdomen: Soft, non tender, non distended, bowel sounds present, no guarding  Extremities: pulses DP and PT palpable bilaterally  Neuro: RUE strength 1/5 and LUE strength 4/5, LE strength 3-4/5 bilaterally  Data Reviewed: Basic Metabolic Panel:  Recent Labs Lab 12/15/2014 1347 01/03/2015 2055 12/20/14 0325 12/21/14 0405  NA 140 136 140 147*  K 3.8 3.7 3.2* 3.6  CL  --  94* 95* 104  CO2 33* 36* 34* 32  GLUCOSE 105 115* 110* 143*  BUN 22.1 23* 21* 28*  CREATININE 0.7 0.59* 0.60* 0.67  CALCIUM 14.3* 12.7* 13.0* 11.7*  MG  --  1.9  --   --   PHOS  --  3.0  --   --    Liver Function Tests:  Recent Labs Lab 12/11/2014 1347 01/05/2015 2055  AST 34 27  ALT 23 24  ALKPHOS 136 115  BILITOT 0.50 0.7  PROT 7.5 7.2  ALBUMIN 2.6* 2.7*   CBC:  Recent Labs Lab 12/22/2014 1347 12/16/2014 2055 12/20/14 0325  12/21/14 0405  WBC 40.7* 36.0* 32.7* 37.3*  NEUTROABS 36.5* 31.6*  --   --   HGB 11.9* 11.5* 12.0* 11.8*  HCT 36.9* 38.3* 38.9* 39.0  MCV 82.4 86.8 86.8 88.2  PLT 499* 401* 317 407*   Recent Results (from the past 240 hour(s))  TECHNOLOGIST REVIEW     Status: None   Collection Time: 12/27/2014  1:47 PM  Result Value Ref Range Status   Technologist Review Occassional Metas and Myelocytes present  Final  Culture, Urine     Status: None   Collection Time: 01/06/2015  4:43 PM  Result Value Ref Range Status   Specimen Description URINE, CLEAN CATCH  Final  Special Requests NONE  Final  Culture, blood (routine x 2)     Status: None (Preliminary result)   Collection Time: 12/11/2014  8:55 PM  Result Value Ref Range Status   Specimen Description BLOOD LEFT ARM  Final   Special Requests AEB 5CC  Final   Culture   Final           BLOOD CULTURE RECEIVED NO GROWTH TO DATE CULTURE WILL BE HELD FOR 5 DAYS BEFORE ISSUING A FINAL NEGATIVE REPORT Performed at [facility]    Report Status PENDING  Incomplete  Culture, blood (routine x 2)     Status: None (Preliminary result)   Collection Time: 12/20/2014  9:03 PM  Result Value Ref Range Status   Specimen Description BLOOD RIGHT ARM  Final   Special Requests BAA [id number]  Final   Culture   Final           BLOOD CULTURE RECEIVED NO GROWTH TO DATE CULTURE WILL BE HELD FOR 5 DAYS BEFORE ISSUING A FINAL NEGATIVE REPORT Performed at [facility]    Report Status PENDING  Incomplete    Scheduled Meds: . cefTRIAXone (ROCEPHIN)  IV  1 g Intravenous Q24H  . dexamethasone  4 mg Intravenous 4 times per day  . enoxaparin (LOVENOX) injection  70 mg Subcutaneous Q12H  . guaiFENesin  600 mg Oral BID  . ipratropium-albuterol  3 mL Nebulization TID   Continuous Infusions: . dextrose 5 % and 0.9 % NaCl with KCl 40 mEq/L 50 mL/hr at 12/20/14 1756

## 2014-12-21 NOTE — Consult Note (Signed)
Radiation Oncology         [phone] ________________________________  Name: Samuel Fritz MRN: [id number]  Date: 01/01/2015  DOB: 14-Feb-1946   DIAGNOSIS: The primary encounter diagnosis was Right shoulder pain. Diagnoses of Compression fracture, Lung mass, Dyspnea, Metastasis, Lytic lesion of bone on x-ray, Abdominal pain, acute, and Cord compression were also pertinent to this visit.   HISTORY OF PRESENT ILLNESS::Samuel Fritz is a 69 y.o. male who is seen for an initial consultation visit regarding the patient's diagnosis of probable metastatic lung cancer .  The patient presented with significant weakness/fatigue. He was having difficulty completing sentences when he was seen in clinic with Dr. Earlie Server and was noted to have dyspnea on exertion. The patient therefore was admitted for further evaluation and management.  According to the patient's family, he had been declining for several months with a greater than 20 pound weight loss. His primary complaint was severe pain in the shoulder region. This lead to decreased range of motion. Patient's workup has included a CT and of the chest which was originally done on 12/12/2014. A large centrally necrotic left upper lobe mass was seen with multiple bilateral pulmonary nodules and osseous metastasis. This was consistent with stage IV lung cancer. In particular, significant replacement of the T8 vertebral body was seen with epidural tumor associated with this in addition to a right scapular 5 a metastasis. A CT scan of the abdomen and pelvis did not reveal any evidence of disease within the abdomen and pelvis. The patient underwent a CT-guided biopsy yesterday with pathology pending. This was obtained from the right scapular lesion.    PREVIOUS RADIATION THERAPY: No   PAST MEDICAL HISTORY:  has a past medical history of Polio; GERD (gastroesophageal reflux disease); Shortness of breath dyspnea; and Cancer.     PAST SURGICAL HISTORY:History  reviewed. No pertinent past surgical history.   FAMILY HISTORY: family history is not on file.   SOCIAL HISTORY:  reports that he has quit smoking. His smoking use included Cigarettes. His smokeless tobacco use includes Chew. He reports that he does not drink alcohol or use illicit drugs.   ALLERGIES: Review of patient's allergies indicates no known allergies.   MEDICATIONS:  Current Facility-Administered Medications  Medication Dose Route Frequency Provider Last Rate Last Dose  . albuterol (PROVENTIL) (2.5 MG/3ML) 0.083% nebulizer solution 2.5 mg  2.5 mg Nebulization Q2H PRN Theodis Blaze, MD      . cefTRIAXone (ROCEPHIN) 1 g in dextrose 5 % 50 mL IVPB - Premix  1 g Intravenous Q24H Theodis Blaze, MD   1 g at 12/20/14 2248  . dexamethasone (DECADRON) injection 4 mg  4 mg Intravenous 4 times per day Curt Bears, MD   4 mg at 12/21/14 0605  . dextrose 5 % and 0.9 % NaCl with KCl 40 mEq/L infusion   Intravenous Continuous Theodis Blaze, MD 50 mL/hr at 12/20/14 1756    . enoxaparin (LOVENOX) injection 70 mg  70 mg Subcutaneous Q12H Anh P Pham, RPH   70 mg at 12/21/14 0846  . guaiFENesin (MUCINEX) 12 hr tablet 600 mg  600 mg Oral BID Theodis Blaze, MD   600 mg at 12/17/2014 2206  . guaiFENesin-dextromethorphan (ROBITUSSIN DM) 100-10 MG/5ML syrup 5 mL  5 mL Oral Q4H PRN Theodis Blaze, MD      . HYDROcodone-acetaminophen (NORCO/VICODIN) 5-325 MG per tablet 1-2 tablet  1-2 tablet Oral Q4H PRN Theodis Blaze, MD      .  ipratropium-albuterol (DUONEB) 0.5-2.5 (3) MG/3ML nebulizer solution 3 mL  3 mL Nebulization TID Theodis Blaze, MD   3 mL at 12/21/14 0851  . LORazepam (ATIVAN) injection 0.5-1 mg  0.5-1 mg Intravenous Q6H PRN Theodis Blaze, MD   1 mg at 12/20/14 1724  . morphine 2 MG/ML injection 2-4 mg  2-4 mg Intravenous Q2H PRN Theodis Blaze, MD   2 mg at 12/21/14 0850  . ondansetron (ZOFRAN) tablet 4 mg  4 mg Oral Q6H PRN Theodis Blaze, MD       Or  . ondansetron [facility]) injection 4 mg  4 mg  Intravenous Q6H PRN Theodis Blaze, MD         REVIEW OF SYSTEMS:  A 15 point review of systems is documented in the electronic medical record. This was obtained by the nursing staff. However, I reviewed this with the patient to discuss relevant findings and make appropriate changes.  Pertinent items are noted in HPI.    PHYSICAL EXAM:  height is '5\' 8"'$  (1.727 m) and weight is 154 lb 1.6 oz (69.899 kg). His axillary temperature is 97.4 F (36.3 C). His blood pressure is 144/88 and his pulse is 83. His respiration is 22 and oxygen saturation is 91%.   ECOG = 2  0 - Asymptomatic (Fully active, able to carry on all predisease activities without restriction)  1 - Symptomatic but completely ambulatory (Restricted in physically strenuous activity but ambulatory and able to carry out work of a light or sedentary nature. For example, light housework, office work)  2 - Symptomatic, <50% in bed during the day (Ambulatory and capable of all self care but unable to carry out any work activities. Up and about more than 50% of waking hours)  3 - Symptomatic, >50% in bed, but not bedbound (Capable of only limited self-care, confined to bed or chair 50% or more of waking hours)  4 - Bedbound (Completely disabled. Cannot carry on any self-care. Totally confined to bed or chair)  5 - Death   Eustace Pen MM, Creech RH, Tormey DC, et al. [phone]). "Toxicity and response criteria of the [facility]". [place] Oncol. 5 (6): 649-55  The patient has reasonable strength bilaterally in the lower extremities on exam today. He states that he has a history of polio which has limited his strength somewhat at baseline. The patient was able to answer some questions today minimally but his wife and family answered most of the questions.   LABORATORY DATA:  Lab Results  Component Value Date   WBC 37.3* 12/21/2014   HGB 11.8* 12/21/2014   HCT 39.0 12/21/2014   MCV 88.2 12/21/2014   PLT 407*  12/21/2014   Lab Results  Component Value Date   NA 147* 12/21/2014   K 3.6 12/21/2014   CL 104 12/21/2014   CO2 32 12/21/2014   Lab Results  Component Value Date   ALT 24 12/26/2014   AST 27 12/23/2014   ALKPHOS 115 12/28/2014   BILITOT 0.7 12/24/2014      RADIOGRAPHY: Dg Chest 2 View  12/26/2014   CLINICAL DATA:  Cough, shortness of breath and chest pain. Lung cancer. Initial encounter.  EXAM: CHEST  2 VIEW  COMPARISON:  Radiographs 12/06/2014.  CT 12/12/2014.  FINDINGS: The dominant left upper lobe mass and adjacent satellite lesions are grossly stable. No superimposed airspace disease, significant pleural effusion or pneumothorax identified. Underlying chronic obstructive pulmonary disease noted. The heart size and  mediastinal contours are stable. The known right scapular and T8 vertebral body metastases are not well visualized.  IMPRESSION: 1. Stable findings of recently diagnosed metastatic lung cancer. 2. No acute superimposed process identified.   Electronically Signed   By: Richardean Sale M.D.   On: 12/12/2014 17:51   Dg Chest 2 View  12/06/2014   CLINICAL DATA:  Chest pain.  Cough.  Shortness of breath.  EXAM: CHEST  2 VIEW  COMPARISON:  None.  FINDINGS: 9.2 by 9.6 cm left mid lung mass appears to be anteriorly located in the left upper lobe. Adjacent lingular 3.5 by 2.7 cm mass. Both of these may above the pleural margin, but I do not see obvious bony destruction.  Atherosclerotic aortic arch. Upper normal heart size. The right lung appears clear. Thoracic spondylosis is present.  IMPRESSION: 1. 9.6 cm left upper lobe mass anteriorly. Adjacent 3.5 cm lingular mass. Appearance highly concerning for malignancy. Chest CT (with contrast if feasible) is recommended. These results will be called to the ordering clinician or representative by the Radiologist Assistant, and communication documented in the PACS or zVision Dashboard.   Electronically Signed   By: Van Clines M.D.   On:  12/06/2014 16:42   Dg Thoracic Spine W/swimmers  12/06/2014   CLINICAL DATA:  Chronic thoracic back pain.  No known injury.  EXAM: THORACIC SPINE - 2 VIEW + SWIMMERS  COMPARISON:  None.  FINDINGS: There is no evidence of thoracic spine fracture. Alignment is normal. Thoracic syndesmophyte formation seen with preservation of intervertebral disc spaces, consistent with diffuse idiopathic skeletal hyperostosis. No focal lytic or sclerotic bone lesions identified.  Enlarged masslike opacity is seen in the left upper lobe, suspicious for bronchogenic carcinoma.  IMPRESSION: No evidence of thoracic spine fracture or other acute findings. Diffuse idiopathic skeletal hyperostosis noted.  Large masslike opacity in the left upper lobe, consistent with bronchogenic carcinoma. Chest CT with contrast recommended for further evaluation.   Electronically Signed   By: Earle Gell M.D.   On: 12/06/2014 16:42   Dg Lumbar Spine Complete  12/06/2014   CLINICAL DATA:  Chronic low back pain.  No known injury.  EXAM: LUMBAR SPINE - COMPLETE 4+ VIEW  COMPARISON:  None.  FINDINGS: Moderate rightward thoracolumbar scoliosis centered at the T12 level. Diffuse degenerative spurring throughout the lumbar spine. Diffuse degenerative facet disease. No fracture.  IMPRESSION: Rightward scoliosis with diffuse degenerative disc and facet disease. No acute findings.   Electronically Signed   By: Rolm Baptise M.D.   On: 12/06/2014 16:41   Dg Shoulder Right  12/06/2014   CLINICAL DATA:  Right shoulder pain. Right shoulder impingement. Limited range of motion.  EXAM: RIGHT SHOULDER - 2+ VIEW  COMPARISON:  None.  FINDINGS: There is no evidence of fracture or dislocation. Moderate acromioclavicular degenerative spurring and sclerosis is seen. Glenohumeral joint is not well profiled on this exam. Generalized osteopenia noted. No other bone abnormality identified.  IMPRESSION: No acute findings.  Moderate acromioclavicular DJD.   Electronically  Signed   By: Earle Gell M.D.   On: 12/06/2014 16:40   Ct Chest W Contrast  12/12/2014   CLINICAL DATA:  Former smoker with left lung mass on radiographs no history of malignancy. Initial encounter.  EXAM: CT CHEST WITH CONTRAST  TECHNIQUE: Multidetector CT imaging of the chest was performed during intravenous contrast administration.  CONTRAST:  75 ml Isovue-300.  COMPARISON:  Chest radiographs 12/06/2014.  FINDINGS: Mediastinum/Nodes: There is a 1.5 cm AP window  node on image 24. There are probable enlarged left hilar lymph nodes, not optimally evaluated due to limited contrast bolus. No other enlarged mediastinal lymph nodes demonstrated. The thyroid gland, trachea and esophagus demonstrate no significant findings. The heart size is normal. There is no pericardial effusion.There is diffuse atherosclerosis of the aorta, great vessels and coronary arteries.  Lungs/Pleura: There is no pleural effusion. As demonstrated radiographically, there is a large centrally necrotic left upper lobe mass, measuring 7.9 x 6.9 cm transverse. There are several satellite lesions, primarily within the left upper lobe, measuring 2.1 cm on image 24, 1.7 cm on image 29 and 4.2 x 2.8 cm on image 34. This latter lesion crosses the major fissure and extends into the lower lobe. There is suspicion of transpleural spread of the dominant left upper lobe lesion into the chest wall. There is no adjacent rib destruction. Multiple other smaller pulmonary nodules are present bilaterally. The largest nodule in the right lung is in the lower lobe, measuring 8 mm on image 30. Moderate diffuse changes of centrilobular emphysema noted.  Upper abdomen:  Unremarkable.  There is no adrenal mass.  Musculoskeletal/Chest wall: There is a large lytic lesion involving the right scapular body, with adjacent soft tissue components measuring up to 6.5 cm on image 13. There is a large lytic lesion which nearly completely replaces the T8 vertebral body. There is  mild associated posterior epidural tumor and superior extension into the inferior aspect of the left T7 vertebral body. There is a possible small lytic metastasis within the C7 vertebral body.  IMPRESSION: 1. Large centrally necrotic left upper lobe lung mass with multiple bilateral pulmonary nodules and osseous metastases consistent with stage IV lung cancer. 2. Most prominent osseous metastases are within the right scapular body and T8 vertebral body. The latter is associated with epidural tumor, placing the patient at risk for cord compression in the future, not currently demonstrated. 3. These results will be called to the ordering clinician or representative by the Radiology Department at the imaging location.   Electronically Signed   By: Richardean Sale M.D.   On: 12/12/2014 13:26   Mr Brain Wo Contrast  12/20/2014   CLINICAL DATA:  Metastatic lung cancer, worsening confusion over last several days.  EXAM: MRI HEAD WITHOUT CONTRAST  TECHNIQUE: Multiplanar, multiecho pulse sequences of the brain and surrounding structures were obtained without intravenous contrast.  COMPARISON:  None.  FINDINGS: Moderately motion degraded examination; per technologist note, multiple unsuccessful attempts made to perform the examination previously.  No reduced diffusion to suggest acute ischemia nor hypercellular tumor. Subcentimeter foci of susceptibility artifact can LEFT temporal lobe, RIGHT posterior temporal lobe, RIGHT posterior frontal lobe. No associated T2 bright signal to suggest vasogenic edema. No midline shift or mass effect. No abnormal intracranial intrinsic T1 shortening. The ventricles and sulci are normal for patient's age. At least mild white matter changes noted.  No abnormal extra-axial fluid collections. Poorly visualized LEFT vertebral artery flow void at this skullbase, axial 2/28.  Ocular globes and orbital contents are unremarkable though motion degrades evaluation. Paranasal sinuses appear well  aerated. Trace probable mastoid effusion versus underpneumatization. No abnormal sellar expansion. No cerebellar tonsillar ectopia. Motion degraded T1 sequences limit sensitivity for abnormal calvarial bone marrow signal.  IMPRESSION: Moderately motion degraded examination. Three subcentimeter foci of susceptibility artifact in the cerebrum without edema, unlikely to reflect acute hemorrhagic metastasis though, consider follow-up MRI of the brain with contrast when patient is better able to tolerate further imaging.  Additional consideration sequelae of hypertension, remote head injury.  Involutional changes. At least mild white matter changes can be seen with chronic small vessel ischemic disease.  Poor visualization of LEFT vertebral artery may reflect artifact, slow flow or, less likely occlusion. As patient had difficulty tolerating MR, consider followup CT angiogram of the head as clinically indicated.   Electronically Signed   By: Elon Alas   On: 12/20/2014 22:58   Ct Abdomen Pelvis W Contrast  12/20/2014   CLINICAL DATA:  Lung cancer, bone metastases, evaluate for metastatic disease within abdomen and pelvis.  EXAM: CT ABDOMEN AND PELVIS WITH CONTRAST  TECHNIQUE: Multidetector CT imaging of the abdomen and pelvis was performed using the standard protocol following bolus administration of intravenous contrast.  CONTRAST:  164m OMNIPAQUE IOHEXOL 300 MG/ML  SOLN  COMPARISON:  CT chest 12/12/2014  FINDINGS: Stable stable pulmonary nodules bilateral lower lobe. Again noted lytic lesion replacing T8 vertebral body. Small hiatal hernia is noted. Heart size within normal limits. Extensive atherosclerotic calcifications and atherosclerotic plaques are noted abdominal aorta and bilateral iliac arteries. Eccentric mural thrombus is noted in distal abdominal aorta. 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 Enhanced liver shows no focal mass. No calcified gallstones are noted within gallbladder. Degenerative changes of the lumbar spine are noted. Enhanced pancreas, spleen and adrenal glands are unremarkable. Degenerative changes bilateral SI joints. No destructive bony lesions are noted within pelvis. Moderate stool noted within right colon. Abundant stool is noted within cecum. No pericecal inflammation.  Normal retrocecal appendix noted in axial image 65. The terminal ileum is unremarkable.  There is a Foley catheter in a decompressed urinary bladder. Moderate stool and gas noted within rectum. Some colonic gas noted in sigmoid colon. No small bowel obstruction. No ascites or free air. No adenopathy. Enhanced kidneys are symmetrical in size. No hydronephrosis or hydroureter. There is a cyst in lower pole anterior aspect of the left kidney measures 2.7 cm. No inguinal adenopathy.  Delayed renal images shows bilateral renal symmetrical excretion. Bilateral visualized proximal ureter is unremarkable.  IMPRESSION: 1. No evidence of metastatic disease within abdomen or pelvis. 2. Again noted lytic lesion replacing T8 vertebral body. 3. Extensive atherosclerotic plaques and calcifications within abdominal aorta and bilateral common iliac arteries. Mural thrombus is noted in distal abdominal aorta. 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 4. Abundant stool within cecum. No pericecal inflammation. Normal appendix. 5. No small bowel or colonic obstruction. 6. No hydronephrosis or hydroureter. There is a cyst in lower pole anterior aspect of the left kidney measures 2.7 cm. 7. Degenerative changes lumbar spine and bilateral SI joints.   Electronically Signed   By: LLahoma CrockerM.D.   On: 12/20/2014 13:01   Ct Biopsy  12/20/2014   INDICATION: Concern for metastatic lung cancer, now with  infiltrative/destructive mass involving the right scapula. Please perform CT and ultrasound guided biopsy for tissue diagnostic purposes.  EXAM: CT AND ULTRASOUND GUIDED BIOPSY OF INFILTRATIVE RIGHT SCAPULAR MASS  COMPARISON:  CT the chest - 12/12/2014  MEDICATIONS: Fentanyl 25 mcg IV; Versed 0.5 mg IV  ANESTHESIA/SEDATION: Sedation time  14 minutes  CONTRAST:  None  COMPLICATIONS: None immediate  PROCEDURE: Informed consent was obtained from the patient following an explanation of the procedure, risks, benefits and alternatives. A time out  was performed prior to the initiation of the procedure.  The patient was positioned prone on the CT table and a limited CT was performed for procedural planning demonstrating unchanged appearance of infiltrative mass involving the right scapula extending to involve the right glenoid measuring at least 8.9 x 7.9 cm (image 9, series 2). The procedure was planned. The operative site was prepped and draped in the usual sterile fashion.  Under direct ultrasound guidance, a 22 gauge spinal needle was advanced into the peripheral aspect of the solid component of the mass after the adjacent tissues were anesthetized with 1% Lidocaine with epinephrine. Appropriate positioning was confirmed with the acquisition of a limited CT.  Next, under direct ultrasound guidance, a 17 gauge coaxial needle was advanced into the peripheral solid component of the scapular mass. Appropriate positioning was confirmed with the acquisition of a limited CT.  Following coaxial needle placement, approximately 50 cc of bloody fluid was aspirated from the mass. All aspirated fluid was capped and sent to the laboratory for analysis. Appropriate positioning was again confirmed with a limited CT scan.  Finally, 4 samples were obtained with an 18 gauge core needle biopsy device under direct ultrasound guidance. Multiple ultrasound images were saved for documentation purposes. The co-axial needle was removed and  hemostasis was achieved with manual compression.  A limited postprocedural CT was negative for hemorrhage or additional complication. A dressing was placed. The patient tolerated the procedure well without immediate postprocedural complication.  IMPRESSION: Technically successful ultrasound and CT guided core needle biopsy of infiltrative right scapular mass. Note, approximately 50 cc of bloody fluid was aspirated from the mass. All aspirated fluid was capped and sent to the laboratory for cytologic analysis.   Electronically Signed   By: Sandi Mariscal M.D.   On: 12/20/2014 14:29       IMPRESSION:  The patient has likely metastatic lung cancer. Pathology pending from a CT-guided biopsy of a right scapular lesion. He has significant replacement of the T8 vertebral body with associated epidural tumor. No cord compression was seen on CT scan. MRI scan has tried to be obtained but the patient has not tolerated this well. His imaging therefore is suboptimal currently, especially with regards to the spine. He has begun steroids and I have been asked to see the patient for consideration of palliative radiation treatment.  I believe that the patient is an appropriate candidate for palliative radiation treatment. The most immediate need for radiation involving the T8 vertebral body as well as the right scapular metastasis. The patient could benefit at a later date from radiation treatment to the left lung tumor. I discussed all of this with the patient and his family today. All of their questions were answered.  PLAN: The patient will proceed with CT simulation on Monday morning for radiation treatment to the T8 vertebral body as well as the right scapular metastasis. We will begin his treatment to these areas as soon as possible after pathology confirms malignancy as suspected.       ________________________________   Jodelle Gross, MD, PhD   **Disclaimer: This note was dictated with voice recognition  software. Similar sounding words can inadvertently be transcribed and this note may contain transcription errors which may not have been corrected upon publication of note.**

## 2014-12-22 LAB — CBC
HEMATOCRIT: 38.6 % — AB (ref 39.0–52.0)
Hemoglobin: 11.2 g/dL — ABNORMAL LOW (ref 13.0–17.0)
MCH: 25.6 pg — ABNORMAL LOW (ref 26.0–34.0)
MCHC: 29 g/dL — ABNORMAL LOW (ref 30.0–36.0)
MCV: 88.1 fL (ref 78.0–100.0)
PLATELETS: 298 10*3/uL (ref 150–400)
RBC: 4.38 MIL/uL (ref 4.22–5.81)
RDW: 14.9 % (ref 11.5–15.5)
WBC: 45.5 10*3/uL — AB (ref 4.0–10.5)

## 2014-12-22 LAB — BASIC METABOLIC PANEL
ANION GAP: 11 (ref 5–15)
Anion gap: 11 (ref 5–15)
BUN: 27 mg/dL — ABNORMAL HIGH (ref 6–20)
BUN: 28 mg/dL — ABNORMAL HIGH (ref 6–20)
CHLORIDE: 100 mmol/L — AB (ref 101–111)
CO2: 31 mmol/L (ref 22–32)
CO2: 32 mmol/L (ref 22–32)
Calcium: 10.5 mg/dL — ABNORMAL HIGH (ref 8.9–10.3)
Calcium: 10.2 mg/dL (ref 8.9–10.3)
Chloride: 101 mmol/L (ref 101–111)
Creatinine, Ser: 0.6 mg/dL — ABNORMAL LOW (ref 0.61–1.24)
Creatinine, Ser: 0.55 mg/dL — ABNORMAL LOW (ref 0.61–1.24)
GFR calc Af Amer: >60 mL/min (ref >60)
GFR calc Af Amer: >60 mL/min (ref >60)
GFR calc non Af Amer: >60 mL/min (ref >60)
GFR calc non Af Amer: >60 mL/min (ref >60)
GLUCOSE: 139 mg/dL — AB (ref 65–99)
Glucose, Bld: 143 mg/dL — ABNORMAL HIGH (ref 65–99)
POTASSIUM: 3.4 mmol/L — AB (ref 3.5–5.1)
Potassium: 3.4 mmol/L — ABNORMAL LOW (ref 3.5–5.1)
SODIUM: 144 mmol/L (ref 135–145)
Sodium: 142 mmol/L (ref 135–145)

## 2014-12-22 MED ORDER — MORPHINE SULFATE 2 MG/ML IJ SOLN
2.0000 mg | Freq: Once | INTRAMUSCULAR | Status: AC
  Administered 2014-12-22: 2 mg via INTRAVENOUS

## 2014-12-22 MED ORDER — ENSURE ENLIVE PO LIQD
237.0000 mL | Freq: Two times a day (BID) | ORAL | Status: DC
  Administered 2014-12-22: 237 mL via ORAL

## 2014-12-22 MED ORDER — KCL IN DEXTROSE-NACL 40-5-0.9 MEQ/L-%-% IV SOLN
INTRAVENOUS | Status: DC
  Administered 2014-12-22 – 2014-12-23 (×2): via INTRAVENOUS
  Filled 2014-12-22 (×2): qty 1000

## 2014-12-22 MED ORDER — GI COCKTAIL ~~LOC~~
30.0000 mL | Freq: Four times a day (QID) | ORAL | Status: DC | PRN
  Administered 2014-12-22: 30 mL via ORAL
  Filled 2014-12-22 (×2): qty 30

## 2014-12-22 NOTE — Progress Notes (Signed)
Initial Nutrition Assessment  DOCUMENTATION CODES:  Severe malnutrition in context of chronic illness  INTERVENTION:  Provide Ensure Enlive po BID, each supplement provides 350 kcal and 20 grams of protein Provide Magic cup TID with meals, each supplement provides 290 kcal and 9 grams of protein Encourage PO intake (small frequent meals) RD to continue to monitor  NUTRITION DIAGNOSIS:  Malnutrition related to chronic illness as evidenced by severe depletion of muscle mass, severe depletion of body fat, percent weight loss (11% x 4 months).  GOAL:  Patient will meet greater than or equal to 90% of their needs  MONITOR:  PO intake, Supplement acceptance, Labs, Weight trends, Skin, I & O's  REASON FOR ASSESSMENT:  Consult Assessment of nutrition requirement/status  ASSESSMENT: 69 year old male diagnosed with lung mass week prior to this admission. Per wife, patient has been progressively declining over the past 4 months with > 20 pounds weight loss. Wife has also noted intermittent episodes of confusion, lethargy, poor oral intake.  Pt asleep during visit, pt's grandson at bedside and provided most history. Per grandson, pt has had progressively worse appetite and PO intake. PTA he was only taking in a few sips of Ensure or Sprite. Since admission, pt has been eating applesauce and jello. Per RN, he has not been eating well. Encouraged family to try and feed patient small frequent meals and suggested requesting snacks from unit.  Per H&P, pt has had 20 lb weight loss over the last 4 months (11% weight loss x 4 months, significant for time frame).  RD to order pt Ensure Enlive and Magic cups. Will follow-up for PO intake and tolerance.  Labs reviewed:  Low K, Creatinine Elevated BUN   Height:  Ht Readings from Last 1 Encounters:  12/20/14 '5\' 8"'$  (1.727 m)    Weight:  Wt Readings from Last 1 Encounters:  12/21/14 154 lb 1.6 oz (69.899 kg)    Ideal Body Weight:  70  kg  Wt Readings from Last 10 Encounters:  12/21/14 154 lb 1.6 oz (69.899 kg)    BMI:  Body mass index is 23.44 kg/(m^2).  Estimated Nutritional Needs:  Kcal:  1800-2000  Protein:  85-95g  Fluid:  1.8L/day    Skin:  Reviewed, no issues  Diet Order:  DIET SOFT Room service appropriate?: Yes; Fluid consistency:: Thin  EDUCATION NEEDS:  No education needs identified at this time   Intake/Output Summary (Last 24 hours) at 12/22/14 1326 Last data filed at 12/22/14 0432  Gross per 24 hour  Intake 616.67 ml  Output    950 ml  Net -333.33 ml    Last BM:  5/11  Samuel Bibles, MS, RD, LDN Pager: [phone] After Hours Pager: [phone]

## 2014-12-22 NOTE — Progress Notes (Signed)
Patient ID: Samuel Fritz, male   DOB: 1945-09-12, 69 y.o.   MRN: [id number]  [facility] PROGRESS NOTE  Samuel Fritz DXI:[id number] DOB: Dec 12, 1945 DOA: 12/12/2014 PCP: Rachell Cipro, MD   Brief narrative:    Patient is 69 year old male who has not seen a physician for extended period of time, apparently diagnosed with lung mass week prior to this admission and referred to Dr. Julien Nordmann for further evaluation. Please note that patient is too weak to provide any history at the time of the admission and wife at bedside able to assist with more details. She explains that patient had an appointment with Dr. Julien Nordmann earlier today prior to this admission where he was noted to be severely weak, had difficulty completing sentences, dyspnea at rest and with exertion noted with oxygen saturation in mid 80s on room air. Blood work was notable for WBC 40 K, calcium 14.3. Dr. Julien Nordmann asked to admit patient for further evaluation and management. Per wife, patient has been progressively declining over the past 4 months with > 20 pounds weight loss. Wife has also noted intermittent episodes of confusion, lethargy, poor oral intake. Patient has been consistently complaining to his wife about right shoulder pain, appeared to be constant, 10 out of 10 in severity, radiating to upper and lower back area, worse with movement, no specific alleviating factors, associated with right arm weakness and wife reports noticing that patient has not been moving his right upper extremity over the past week. Wife denies noticing any fevers or chills, no specific abdominal or urinary concerns reported. In the oncologist office, urine with blood noted. [facility] asked to admit for further evaluation and management.  Assessment/Plan:    Principal Problem:  Weakness - Appears to be multifactorial and secondary to progressive failure to thrive and deconditioning in the setting of what appears to be metastatic malignancy - More detailed  workup in progress outlined below - pt looks better this AM but still very weak overall  - continue to provide supportive care with analgesia as needed to ensure comfort Active Problems:  Lung mass, secondary to ? 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 Lisbeth Renshaw assistance greatly, plan for simulation radiation on Monday  - Provide analgesia as needed  ? Sepsis on admission  - VS mostly secondary to acute respiratory failure in the setting of metastatic lung cancer rather than an infectious source  - however, May 15th, 2016, pt with T 96.6 F and WBC up to 45.5, on rocephin for presumptive UTI - pt started on Rocephin on admission sue to concern of UTI, urine culture with no growth  - today is day #4 of Rocephin, will continue same regimen but with low threshold for broadening spectrum  - will check procalcitonin and lactic acid level - last lactic acid was WNL (on admission)  Hypercalcemia - Likely secondary to malignancy, IV Zometa requested as recommended by oncologist on admission -  pt responding to IVF, Ca is trending down  - continue to provide IVF  - repeat BMP in AM  Severe protein-calorie malnutrition - In the setting of illness outlined above - Significant weight loss also related to progression of the metastatic lung cancer - advance diet if pt able to tolerate  - Nutrition specialist consulted and  assistance is appreciated   High risk for Cord compression - Significant weakness in right upper extremity, recent CT chest on 12/12/2014 noted worrisome possibility of cord compression given metastatic spread - pt unable to tolerate MRI for now  - Radiation oncologist consulted, continue Decadron IV for now  - plan to proceed with simulation on Monday    Hypokalemia - supplement and repeat BMP in AM   Hypernatremia, acute - secondary to pre renal etiology - crackles on exam preclude use of more aggressive IVF hydration - gentle hydration was provided  - Na is now WNL  - monitor daily weights, I/O  Acute encephalopathy - Possibly secondary to metastatic spread of lung cancer, hypercalcemia, dehydration  - MRI of the brain requested for further evaluation, motion degraded exam, no clear evidence of tumor spread but MRI brain w/c recommended once pt able to tolerate procedure  - better this AM   Acute respiratory failure with hypoxia - Secondary to lung cancer with likely underlying COPD (pt with long history of smoking but apparently quit few years ago) - Please note the chest x-ray does not mention COPD however, image notable for hyperinflated lungs with flattening of the diaphragms consistent with stigmata of rather advanced COPD - there is a possibility of underlying PE but pt currently unable to proceed with further testing - pt also with mild crackles on exam 5/14 but better this AM - Lovenox started per pharmacy for mural thrombus noted in distal abd aorta  - Keep on oxygen via nasal cannula for now, provide bronchodilators scheduled and as needed   Acute functional quadriplegia - in the setting of advanced malignancy with metastatic spread in pt with underlying polio  - unable to get out of the bed, needs two people assistance at minimum  - will eventually need PT once more medically stable   Thrombocytosis - Appears to be reactive, remains stable overall  - repeat CBC in the  morning  Anemia of chronic disease, IDA and malignancy  - iron level 23 - Anemia of malignancy, no signs of active bleeding, repeat CBC in the morning - limited PO intake at this time, will start oral supplementation upon discharge   DVT prophylaxis - Lovenox to be dosed per pharmacy   Code Status: DNR Family Communication: Pt and family at bedside Disposition Plan: Cancer work up in progress  IV access:  Peripheral IV  Procedures and diagnostic studies:    CXR 12/24/2014  Stable findings of recently diagnosed metastatic lung cancer. 2. No acute superimposed process   Ct Chest W Contrast   12/12/2014   1. Large centrally necrotic left upper lobe lung mass with multiple bilateral pulmonary nodules and osseous metastases consistent with stage IV lung cancer. 2. Most prominent osseous metastases are within the right scapular body and T8 vertebral body. The latter is associated with epidural tumor, placing the patient at risk for cord compression in future, not demonstrated.  Ct Abdomen Pelvis W Contrast  12/20/2014  1. No evidence of metastatic disease within abdomen or pelvis. 2. Again noted lytic lesion replacing T8 vertebral body. 3. Extensive atherosclerotic plaques and calcifications within abdominal aorta  and bilateral common iliac arteries. Mural thrombus is noted in distal abdominal aorta. 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 4. Abundant stool within cecum. No pericecal inflammation. Normal appendix. 5. No small bowel or colonic obstruction. 6. No hydronephrosis or hydroureter. There is a cyst in lower pole anterior aspect of the left kidney measures 2.7 cm. 7. Degenerative changes lumbar spine and bilateral SI joints.     Ct Biopsy  12/20/2014  Technically successful ultrasound and CT guided core needle biopsy of infiltrative right scapular mass. Note, approximately 50 cc of  bloody fluid was aspirated from the mass. All aspirated fluid was capped and sent to the laboratory for cytologic analysis.     Mr Brain Wo Contrast  12/20/2014  Moderately motion degraded examination. Three subcentimeter foci of susceptibility artifact in the cerebrum without edema, unlikely to reflect acute hemorrhagic metastasis though, consider follow-up MRI of the brain with contrast when patient is better able to tolerate further imaging. Additional consideration sequelae of hypertension, remote head injury.  Involutional changes. At least mild white matter changes can be seen with chronic small vessel ischemic disease.  Poor visualization of LEFT vertebral artery may reflect artifact, slow flow or, less likely occlusion. As patient had difficulty tolerating MR, consider followup CT angiogram of the head as clinically indicated.    Medical Consultants:  Interventional radiology Radiation oncology Oncology   Other Consultants:  None  IAnti-Infectives:   Rocephin 5/12 -->  Faye Ramsay, MD  [facility] Pager [phone]  If 7PM-7AM, please contact night-coverage [email or web address] Password [email or web address] 12/22/2014, 11:28 AM   LOS: 3 days   HPI/Subjective: No events overnight. Significant discomfort and dyspnea with exertion.   Objective: Filed Vitals:   12/21/14 1437 12/21/14 1933 12/21/14 2033 12/22/14 0431  BP:   140/70 144/73  Pulse:   91 75  Temp:   97.4 F (36.3 C) 96.6 F (35.9 C)  TempSrc:   Oral Axillary  Resp:   16 16  Height:      Weight:      SpO2: 94% 93% 93% 100%    Intake/Output Summary (Last 24 hours) at 12/22/14 1128 Last data filed at 12/22/14 0432  Gross per 24 hour  Intake 736.67 ml  Output    950 ml  Net -213.33 ml    Exam:   General:  Pt is more alert this AM but remains weak overall   Cardiovascular: Regular rhythm,tachycardic, no rubs, no gallops  Respiratory: Course breath sounds bilaterally with crackles at bases   Abdomen: Soft, non tender, non  distended, bowel sounds present, no guarding  Extremities: pulses DP and PT palpable bilaterally  Neuro: RUE strength 1/5 and LUE strength 4/5, LE strength 3-4/5 bilaterally  Data Reviewed: Basic Metabolic Panel:  Recent Labs Lab 12/27/2014 1347 12/17/2014 2055 12/20/14 0325 12/21/14 0405 12/22/14 0423  NA 140 136 140 147* 142  K 3.8 3.7 3.2* 3.6 3.4*  CL  --  94* 95* 104 100*  CO2 33* 36* 34* 32 31  GLUCOSE 105 115* 110* 143* 143*  BUN 22.1 23* 21* 28* 27*  CREATININE 0.7 0.59* 0.60* 0.67 0.60*  CALCIUM 14.3* 12.7* 13.0* 11.7* 10.5*  MG  --  1.9  --   --   --   PHOS  --  3.0  --   --   --    Liver Function Tests:  Recent Labs Lab 01/05/2015 1347 12/20/2014 2055  AST 34 27  ALT 23 24  ALKPHOS 136 115  BILITOT 0.50 0.7  PROT 7.5 7.2  ALBUMIN 2.6* 2.7*   CBC:  Recent Labs Lab 01/07/2015 1347 12/20/2014 2055 12/20/14 0325 12/21/14 0405 12/22/14 0423  WBC 40.7* 36.0* 32.7* 37.3* 45.5*  NEUTROABS 36.5* 31.6*  --   --   --   HGB 11.9* 11.5* 12.0* 11.8* 11.2*  HCT 36.9* 38.3* 38.9* 39.0 38.6*  MCV 82.4 86.8 86.8 88.2 88.1  PLT 499* 401* 317 407* 298   Recent Results (from the past 240 hour(s))  TECHNOLOGIST REVIEW     Status: None   Collection Time: 12/21/2014  1:47 PM  Result Value Ref Range Status   Technologist Review Occassional Metas and Myelocytes present  Final  Culture, Urine     Status: None   Collection Time: 12/30/2014  4:43 PM  Result Value Ref Range Status   Specimen Description URINE, CLEAN CATCH  Final   Special Requests NONE  Final  Culture, blood (routine x 2)     Status: None (Preliminary result)   Collection Time: 01/06/2015  8:55 PM  Result Value Ref Range Status   Specimen Description BLOOD LEFT ARM  Final   Special Requests AEB 5CC  Final   Culture   Final           BLOOD CULTURE RECEIVED NO GROWTH TO DATE CULTURE WILL BE HELD FOR 5 DAYS BEFORE ISSUING A FINAL NEGATIVE REPORT Performed at [facility]    Report Status PENDING  Incomplete   Culture, blood (routine x 2)     Status: None (Preliminary result)   Collection Time: 12/18/2014  9:03 PM  Result Value Ref Range Status   Specimen Description BLOOD RIGHT ARM  Final   Special Requests BAA 10CC  Final   Culture   Final           BLOOD CULTURE RECEIVED NO GROWTH TO DATE CULTURE WILL BE HELD FOR 5 DAYS BEFORE ISSUING A FINAL NEGATIVE REPORT Performed at [facility]    Report Status PENDING  Incomplete    Scheduled Meds: . cefTRIAXone (ROCEPHIN)  IV  1 g Intravenous Q24H  . dexamethasone  4 mg Intravenous 4 times per day  . enoxaparin (LOVENOX) injection  70 mg Subcutaneous Q12H  . guaiFENesin  600 mg Oral BID  . ipratropium-albuterol  3 mL Nebulization TID   Continuous Infusions: . dextrose 5 % and 0.9 % NaCl with KCl 40 mEq/L 25 mL/hr at 12/21/14 1306

## 2014-12-22 NOTE — Discharge Instructions (Signed)

## 2014-12-23 ENCOUNTER — Ambulatory Visit
Admit: 2014-12-23 | Discharge: 2014-12-23 | Disposition: A | Discharge status: Home / Self Care | Payer: Medicare Other | Attending: Radiation Oncology | Admitting: Radiation Oncology

## 2014-12-23 ENCOUNTER — Inpatient Hospital Stay (HOSPITAL_COMMUNITY): Payer: Medicare Other

## 2014-12-23 ENCOUNTER — Other Ambulatory Visit: Payer: Self-pay | Admitting: Radiation Oncology

## 2014-12-23 ENCOUNTER — Telehealth: Payer: Self-pay | Admitting: *Deleted

## 2014-12-23 DIAGNOSIS — C7951 Secondary malignant neoplasm of bone: Secondary | ICD-10-CM

## 2014-12-23 LAB — BASIC METABOLIC PANEL
Anion gap: 9 (ref 5–15)
BUN: 28 mg/dL — ABNORMAL HIGH (ref 6–20)
CO2: 30 mmol/L (ref 22–32)
Calcium: 9.9 mg/dL (ref 8.9–10.3)
Chloride: 104 mmol/L (ref 101–111)
Creatinine, Ser: 0.51 mg/dL — ABNORMAL LOW (ref 0.61–1.24)
GFR calc Af Amer: >60 mL/min (ref >60)
GFR calc non Af Amer: >60 mL/min (ref >60)
GLUCOSE: 153 mg/dL — AB (ref 65–99)
POTASSIUM: 3.9 mmol/L (ref 3.5–5.1)
SODIUM: 143 mmol/L (ref 135–145)

## 2014-12-23 LAB — CBC
HEMATOCRIT: 39.2 % (ref 39.0–52.0)
HEMOGLOBIN: 11.6 g/dL — AB (ref 13.0–17.0)
MCH: 26.5 pg (ref 26.0–34.0)
MCHC: 29.6 g/dL — ABNORMAL LOW (ref 30.0–36.0)
MCV: 89.5 fL (ref 78.0–100.0)
Platelets: 380 10*3/uL (ref 150–400)
RBC: 4.38 MIL/uL (ref 4.22–5.81)
RDW: 14.8 % (ref 11.5–15.5)
WBC: 46.3 10*3/uL — ABNORMAL HIGH (ref 4.0–10.5)

## 2014-12-23 MED ORDER — FUROSEMIDE 10 MG/ML IJ SOLN
40.0000 mg | Freq: Once | INTRAMUSCULAR | Status: AC
Start: 2014-12-23 — End: 2014-12-23
  Administered 2014-12-23: 40 mg via INTRAVENOUS
  Filled 2014-12-23: qty 4

## 2014-12-23 MED ORDER — HYDROMORPHONE HCL 1 MG/ML IJ SOLN
1.0000 mg | Freq: Once | INTRAMUSCULAR | Status: AC
  Administered 2014-12-23: 1 mg via INTRAVENOUS

## 2014-12-23 MED ORDER — DEXAMETHASONE SODIUM PHOSPHATE 4 MG/ML IJ SOLN
2.0000 mg | Freq: Four times a day (QID) | INTRAMUSCULAR | Status: DC
  Administered 2014-12-23 – 2014-12-24 (×5): 2 mg via INTRAVENOUS
  Filled 2014-12-23 (×8): qty 0.5

## 2014-12-23 MED ORDER — SODIUM CHLORIDE 0.9 % IV SOLN
2.5000 mg/h | INTRAVENOUS | Status: DC
  Administered 2014-12-23: 0.5 mg/h via INTRAVENOUS
  Administered 2014-12-24 (×2): 1 mg/h via INTRAVENOUS
  Administered 2014-12-25: 2 mg/h via INTRAVENOUS
  Filled 2014-12-23 (×4): qty 2.5

## 2014-12-23 MED ORDER — SCOPOLAMINE 1 MG/3DAYS TD PT72
1.0000 | MEDICATED_PATCH | TRANSDERMAL | Status: DC
  Administered 2014-12-23: 1.5 mg via TRANSDERMAL
  Filled 2014-12-23: qty 1

## 2014-12-23 MED ORDER — LORAZEPAM 2 MG/ML IJ SOLN
2.0000 mg | Freq: Four times a day (QID) | INTRAMUSCULAR | Status: DC | PRN
  Administered 2014-12-23: 2 mg via INTRAVENOUS
  Filled 2014-12-23: qty 1

## 2014-12-23 MED ORDER — HALOPERIDOL LACTATE 5 MG/ML IJ SOLN
1.0000 mg | Freq: Once | INTRAMUSCULAR | Status: AC
  Administered 2014-12-23: 1 mg via INTRAVENOUS

## 2014-12-23 MED ORDER — PIPERACILLIN-TAZOBACTAM 3.375 G IVPB
3.3750 g | Freq: Three times a day (TID) | INTRAVENOUS | Status: DC
  Administered 2014-12-23: 3.375 g via INTRAVENOUS
  Filled 2014-12-23 (×2): qty 50

## 2014-12-23 MED ORDER — HYDROMORPHONE BOLUS VIA INFUSION
1.0000 mg | INTRAVENOUS | Status: DC | PRN
  Administered 2014-12-23 – 2014-12-24 (×3): 1 mg via INTRAVENOUS
  Filled 2014-12-23 (×4): qty 1

## 2014-12-23 MED ORDER — HALOPERIDOL LACTATE 5 MG/ML IJ SOLN
INTRAMUSCULAR | Status: AC
  Filled 2014-12-23: qty 1

## 2014-12-23 MED ORDER — HYDROMORPHONE HCL 1 MG/ML IJ SOLN
INTRAMUSCULAR | Status: AC
  Filled 2014-12-23: qty 1

## 2014-12-23 MED ORDER — DIAZEPAM 5 MG/ML IJ SOLN
5.0000 mg | INTRAMUSCULAR | Status: DC | PRN
  Administered 2014-12-23 – 2014-12-24 (×6): 5 mg via INTRAVENOUS
  Filled 2014-12-23 (×6): qty 2

## 2014-12-23 NOTE — Progress Notes (Addendum)
Patient ID: SHEY BARTMESS, male   DOB: 1945-09-24, 69 y.o.   MRN: [id number]  [facility] PROGRESS NOTE  NILSON TABORA DXI:[id number] DOB: Jun 14, 1946 DOA: 12/27/2014 PCP: Rachell Cipro, MD   Brief narrative:    Patient is 69 year old male who has not seen a physician for extended period of time, apparently diagnosed with lung mass week prior to this admission and referred to Dr. Julien Nordmann for further evaluation. Please note that patient is too weak to provide any history at the time of the admission and wife at bedside able to assist with more details. She explains that patient had an appointment with Dr. Julien Nordmann earlier today prior to this admission where he was noted to be severely weak, had difficulty completing sentences, dyspnea at rest and with exertion noted with oxygen saturation in mid 80s on room air. Blood work was notable for WBC 40 K, calcium 14.3. Dr. Julien Nordmann asked to admit patient for further evaluation and management. Per wife, patient has been progressively declining over the past 4 months with > 20 pounds weight loss. Wife has also noted intermittent episodes of confusion, lethargy, poor oral intake. Patient has been consistently complaining to his wife about right shoulder pain, appeared to be constant, 10 out of 10 in severity, radiating to upper and lower back area, worse with movement, no specific alleviating factors, associated with right arm weakness and wife reports noticing that patient has not been moving his right upper extremity over the past week. Wife denies noticing any fevers or chills, no specific abdominal or urinary concerns reported. In the oncologist office, urine with blood noted. [facility] asked to admit for further evaluation and management.  Major events since admission: 5/15 - more alert this AM, follows commands appropriately, requiring higher dose of morphine in the afternoon  5/16 - worsening respiratory failure, ? Aspiration, palliative care team  consulted  Assessment/Plan:    Principal Problem:   Acute on chronic respiratory failure with hypoxia in pt with known COPD, lung cancer - worse this Am 5/16, worrisome for pulmonary vascular congestion vs aspiration - portable CXR requested - will give one dose of Lasix now IV to see if this help somewhat - d/w family at bedside, pt's prognosis is guarded and he is requiring more analgesia for symptom control - family agrees with PCT consultation  - there is a possibility of underlying PE but pt currently unable to proceed with further testing - Lovenox started per pharmacy for mural thrombus noted in distal abd aorta  - Keep on oxygen via nasal cannula for now, provide bronchodilators scheduled and as needed   Weakness - Appears to be multifactorial and secondary to progressive failure to thrive and deconditioning in the setting of what appears to be metastatic malignancy - pt more somnolent and agitated this AM, not following any commands - continue to provide supportive care with analgesia as needed to ensure comfort - PCT consulted  Active Problems:  Lung mass, secondary to ? 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 Lisbeth Renshaw assistance greatly, plan for simulation radiation on Monday but not sure pt will be able to tolerate given this progressive  decline  - Provide analgesia as needed  ? Sepsis on admission  - VS mostly secondary to acute respiratory failure in the setting of metastatic lung cancer rather than an infectious source  - however, May 15th, 2016, pt with T 96.6 F and WBC up to 45.5, started on rocephin for presumptive UTI - pt started on Rocephin on admission due to concern of UTI, urine culture with no growth  - today is day #5 of Rocephin, will change to Zosyn to cover for aspiration  - last lactic acid was WNL (on admission)  Hypercalcemia - Likely secondary to malignancy, IV Zometa requested as recommended by oncologist on admission - Ca is trending down  - pt with more crackles on exam this Am 5/16 and this precludes use of more aggressive hydration - stop IVF and follow up on BMP this AM   Severe protein-calorie malnutrition - In the setting of illness outlined above - Significant weight loss also related to progression of the metastatic lung cancer - high risk for aspiration, it is very likely pt already aspirated - will keep NPO for now  High risk for Cord compression - Significant weakness in right upper extremity, recent CT chest on 12/12/2014 noted worrisome possibility of cord compression given metastatic spread - pt unable to tolerate MRI  - Radiation oncologist consulted, continue Decadron IV for now  - plan to proceed with simulation today but not sure pt will be able to tolerate    Hypokalemia - supplemented, BMP pending this AM    Hypernatremia, acute - secondary to pre renal etiology - crackles on exam preclude use of more aggressive IVF hydration - will stop IVF  Acute encephalopathy, worse am of 5/16 - Possibly secondary to metastatic spread of lung cancer, hypercalcemia, dehydration - MRI of the brain  motion degraded, no clear evidence of tumor spread but MRI brain w/c recommended, please note that pt is unable to tolerate the exam due progressive decline and agitation, restlessness  -  worse this AM, again possibly due to aspiration, pulmonary vascular congestion imposed on the above    Acute functional quadriplegia - in the setting of advanced malignancy with metastatic spread in pt with underlying polio  - unable to get out of the bed, needs two people assistance at minimum  - more agitated this AM, unable to follow commands, requiring more sedation   Thrombocytosis - Appears to be reactive, remains stable overall   Anemia of chronic disease, IDA and malignancy  - iron level 23 - Anemia of malignancy, no signs of active bleeding - keep NPO for now   DVT prophylaxis - Lovenox to be dosed per pharmacy   Code Status: DNR Family Communication: Pt and family at bedside, d/w worsening status this am, very guarded prognosis, family agrees with palliative care consultation  Disposition Plan: Worsening status this am, unable to plan d/c at this point   IV access:  Peripheral IV  Procedures and diagnostic studies:    CXR 12/31/2014  Stable findings of recently diagnosed metastatic lung cancer. 2. No acute superimposed process   Ct Chest W Contrast   12/12/2014   1. Large centrally necrotic left upper lobe lung mass with multiple  bilateral pulmonary nodules and osseous metastases consistent with stage IV lung cancer. 2. Most prominent osseous metastases are within the right scapular body and T8 vertebral body. The latter is associated with epidural tumor, placing the patient at risk for cord compression in future, not demonstrated.  Ct Abdomen Pelvis W Contrast  12/20/2014  1. No evidence of metastatic disease within abdomen or pelvis. 2. Again noted lytic lesion replacing T8 vertebral body. 3. Extensive atherosclerotic plaques and calcifications within abdominal aorta and bilateral common iliac arteries. Mural thrombus is noted in distal abdominal aorta. 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 Extensive mural thrombosis in proximal  left common iliac artery. 4. Abundant stool within cecum. No pericecal inflammation. Normal appendix. 5. No small bowel or colonic obstruction. 6. No hydronephrosis or hydroureter. There is a cyst in lower pole anterior aspect of the left kidney measures 2.7 cm. 7. Degenerative changes lumbar spine and bilateral SI joints.     Ct Biopsy  12/20/2014  Technically successful ultrasound and CT guided core needle biopsy of infiltrative right scapular mass. Note, approximately 50 cc of bloody fluid was aspirated from the mass. All aspirated fluid was capped and sent to the laboratory for cytologic analysis.     Mr Brain Wo Contrast  12/20/2014  Moderately motion degraded examination. Three subcentimeter foci of susceptibility artifact in the cerebrum without edema, unlikely to reflect acute hemorrhagic metastasis though, consider follow-up MRI of the brain with contrast when patient is better able to tolerate further imaging. Additional consideration sequelae of hypertension, remote head injury.  Involutional changes. At least mild white matter changes can be seen with chronic small vessel ischemic disease.  Poor visualization of LEFT vertebral artery may reflect artifact, slow flow or, less likely occlusion. As patient had difficulty tolerating MR, consider followup CT angiogram of the head as clinically indicated.    Medical Consultants:  Interventional radiology Radiation oncology Oncology  PCT  Other Consultants:  None  IAnti-Infectives:   Rocephin 5/12 --> 5/16 Zosyn 5/16 -->  Faye Ramsay, MD  [facility] Pager [phone]  If 7PM-7AM, please contact night-coverage [email or web address] Password [email or web address] 12/23/2014, 8:16 AM   LOS: 4 days   HPI/Subjective: Significant discomfort and dyspnea this AM. Agitation, restlessness.   Objective: Filed Vitals:   12/22/14 2103 12/23/14 0206 12/23/14 0440 12/23/14 0809  BP: 142/81  140/82   Pulse:   92   Temp: 97.6 F (36.4 C)  96.5 F (35.8 C)   TempSrc:  Axillary  Axillary   Resp: 16  28   Height:      Weight:      SpO2: 97% 96% 96% 96%    Intake/Output Summary (Last 24 hours) at 12/23/14 0816 Last data filed at 12/23/14 0441  Gross per 24 hour  Intake 301.25 ml  Output    925 ml  Net -623.75 ml    Exam:   General:  Pt is somnolent and in moderate distress due to dyspnea, restless and agitated   Cardiovascular: Regular rhythm,tachycardic, no rubs, no gallops  Respiratory: Course breath sounds bilaterally, bibasilar crackles and rhonchi, RR in mid 20's  Abdomen: Soft, non tender, non distended, bowel sounds present  Neuro: somnolent, moving LE's spontaneously, agitated with examination   Data Reviewed: Basic Metabolic Panel:  Recent Labs Lab 12/16/2014 2055 12/20/14 0325 12/21/14 0405 12/22/14 0423 12/22/14 1230  NA 136 140 147* 142 144  K 3.7 3.2* 3.6 3.4* 3.4*  CL 94* 95* 104 100* 101  CO2 36* 34* 32 31 32  GLUCOSE 115* 110* 143* 143* 139*  BUN 23* 21* 28* 27* 28*  CREATININE 0.59* 0.60* 0.67 0.60* 0.55*  CALCIUM 12.7* 13.0* 11.7* 10.5* 10.2  MG 1.9  --   --   --   --   PHOS 3.0  --   --   --   --    Liver Function Tests:  Recent Labs Lab 01/05/2015 1347 12/08/2014 2055  AST 34 27  ALT 23 24  ALKPHOS 136 115  BILITOT 0.50 0.7  PROT 7.5 7.2  ALBUMIN 2.6* 2.7*   CBC:  Recent Labs Lab 12/16/2014 1347 01/02/2015 2055 12/20/14 0325 12/21/14 0405 12/22/14 0423 12/23/14 0414  WBC 40.7* 36.0* 32.7* 37.3* 45.5* 46.3*  NEUTROABS 36.5* 31.6*  --   --   --   --   HGB 11.9* 11.5* 12.0* 11.8* 11.2* 11.6*  HCT 36.9* 38.3* 38.9* 39.0 38.6* 39.2  MCV 82.4 86.8 86.8 88.2 88.1 89.5  PLT 499* 401* 317 407* 298 380   Recent Results (from the past 240 hour(s))  TECHNOLOGIST REVIEW     Status: None   Collection Time: 12/30/2014  1:47 PM  Result Value Ref Range Status   Technologist Review Occassional Metas and Myelocytes present  Final  Culture, Urine     Status: None   Collection Time: 12/13/2014  4:43 PM  Result  Value Ref Range Status   Specimen Description URINE, CLEAN CATCH  Final   Special Requests NONE  Final  Culture, blood (routine x 2)     Status: None (Preliminary result)   Collection Time: 01/05/2015  8:55 PM  Result Value Ref Range Status   Specimen Description BLOOD LEFT ARM  Final   Special Requests AEB 5CC  Final   Culture   Final           BLOOD CULTURE RECEIVED NO GROWTH TO DATE CULTURE WILL BE HELD FOR 5 DAYS BEFORE ISSUING A FINAL NEGATIVE REPORT Performed at [facility]    Report Status PENDING  Incomplete  Culture, blood (routine x 2)     Status: None (Preliminary result)   Collection Time: 01/04/2015  9:03 PM  Result Value Ref Range Status   Specimen Description BLOOD RIGHT ARM  Final   Special Requests BAA 10CC  Final   Culture   Final           BLOOD CULTURE RECEIVED NO GROWTH TO DATE CULTURE WILL BE HELD FOR 5 DAYS BEFORE ISSUING A FINAL NEGATIVE REPORT Performed at [facility]    Report Status PENDING  Incomplete    Scheduled Meds: . cefTRIAXone (ROCEPHIN)  IV  1 g Intravenous Q24H  . dexamethasone  4 mg Intravenous 4 times per day  . enoxaparin (LOVENOX) injection  70 mg Subcutaneous Q12H  . feeding supplement (ENSURE ENLIVE)  237 mL Oral BID BM  . furosemide  40 mg Intravenous Once  . guaiFENesin  600 mg Oral BID  . ipratropium-albuterol  3 mL Nebulization TID   Continuous Infusions: . dextrose 5 % and 0.9 % NaCl with KCl 40 mEq/L 25 mL/hr at 12/23/14 [id number]

## 2014-12-23 NOTE — Progress Notes (Signed)
I visited with the patient and his family today. The patient's clinical status has significantly declined since I initially saw him and we discussed radiation treatment. A number of family members were gathered with him today. He was scheduled to proceed with simulation/treatment planning today but this was canceled due to the patient's grave prognosis and decline.  I discussed with the family that the patient was unable to proceed with radiation treatment at this time. Also, I do not believe that the target lesions are life threatening and I do not believe that his course would significantly be altered even if we could proceed with radiation treatment. I agree with the patient's current management focusing on comfort measures. I will continue to follow the patient.

## 2014-12-23 NOTE — Telephone Encounter (Signed)
spoke with Deanna,RN, patient status ,scheduled for 1000 am CT simulation right scapula and T_8 mark and start, patient still get agitated easily,ws just increased ativan '2mg'$  , and getting morphine q 2 hours,he aspirated yesterday,thanked Deanna,spoke with Dr.Moody , will hold off on todays simulation called and spoke with Caren Griffins on [address]  To let Deanna,RN we are holding off this appt today at thiis time per MD 8:57 AM

## 2014-12-23 NOTE — Progress Notes (Signed)
ANTIBIOTIC CONSULT NOTE - INITIAL  Pharmacy Consult for Zosyn Indication: aspiratin PNA  No Known Allergies  Patient Measurements: Height: '5\' 8"'$  (172.7 cm) Weight:  (unable to do it. patient was restless) IBW/kg (Calculated) : 68.4  Vital Signs: Temp: 96.5 F (35.8 C) (05/16 0440) Temp Source: Axillary (05/16 0440) BP: 140/82 mmHg (05/16 0440) Pulse Rate: 92 (05/16 0440) Intake/Output from previous day: 05/15 0701 - 05/16 0700 In: 301.3 [P.O.:300; I.V.:1.3] Out: 925 [Urine:925] Intake/Output from this shift:    Labs:  Recent Labs  12/21/14 0405 12/22/14 0423 12/22/14 1230 12/23/14 0414 12/23/14 0714  WBC 37.3* 45.5*  --  46.3*  --   HGB 11.8* 11.2*  --  11.6*  --   PLT 407* 298  --  380  --   CREATININE 0.67 0.60* 0.55*  --  0.51*   Estimated Creatinine Clearance: 84.3 mL/min (by C-G formula based on Cr of 0.51). No results for input(s): VANCOTROUGH, VANCOPEAK, VANCORANDOM, GENTTROUGH, GENTPEAK, GENTRANDOM, TOBRATROUGH, TOBRAPEAK, TOBRARND, AMIKACINPEAK, AMIKACINTROU, AMIKACIN in the last 72 hours.   Microbiology: Recent Results (from the past 720 hour(s))  TECHNOLOGIST REVIEW     Status: None   Collection Time: 01/02/2015  1:47 PM  Result Value Ref Range Status   Technologist Review Occassional Metas and Myelocytes present  Final  Culture, Urine     Status: None   Collection Time: 12/11/2014  4:43 PM  Result Value Ref Range Status   Specimen Description URINE, CLEAN CATCH  Final   Special Requests NONE  Final   Colony Count NO GROWTH Performed at [facility]   Final   Culture NO GROWTH Performed at [facility]   Final   Report Status 12/20/2014 FINAL  Final  Culture, blood (routine x 2)     Status: None (Preliminary result)   Collection Time: 01/05/2015  8:55 PM  Result Value Ref Range Status   Specimen Description BLOOD LEFT ARM  Final   Special Requests AEB 5CC  Final   Culture   Final           BLOOD CULTURE RECEIVED NO GROWTH TO DATE  CULTURE WILL BE HELD FOR 5 DAYS BEFORE ISSUING A FINAL NEGATIVE REPORT Performed at [facility]    Report Status PENDING  Incomplete  Culture, blood (routine x 2)     Status: None (Preliminary result)   Collection Time: 12/22/2014  9:03 PM  Result Value Ref Range Status   Specimen Description BLOOD RIGHT ARM  Final   Special Requests BAA 10CC  Final   Culture   Final           BLOOD CULTURE RECEIVED NO GROWTH TO DATE CULTURE WILL BE HELD FOR 5 DAYS BEFORE ISSUING A FINAL NEGATIVE REPORT Performed at [facility]    Report Status PENDING  Incomplete   Medical History: Past Medical History  Diagnosis Date  . Polio   . GERD (gastroesophageal reflux disease)   . Shortness of breath dyspnea   . Cancer     Lung cancer   Medications:  Scheduled:  . dexamethasone  4 mg Intravenous 4 times per day  . enoxaparin (LOVENOX) injection  70 mg Subcutaneous Q12H  . ipratropium-albuterol  3 mL Nebulization TID  . piperacillin-tazobactam (ZOSYN)  IV  3.375 g Intravenous Q8H   Anti-infectives    Start     Dose/Rate Route Frequency Ordered Stop   12/23/14 1000  piperacillin-tazobactam (ZOSYN) IVPB 3.375 g     3.375 g  12.5 mL/hr over 240 Minutes Intravenous Every 8 hours 12/23/14 0845     12/15/2014 2000  cefTRIAXone (ROCEPHIN) 1 g in dextrose 5 % 50 mL IVPB - Premix  Status:  Discontinued     1 g 100 mL/hr over 30 Minutes Intravenous Every 24 hours 12/31/2014 1837 12/23/14 0826     Assessment: 38 yoM with new lung mass, scapular mass, possible brain mets. Admit 5/12 for dyspnea, weakness, mental status changes; sent from [facility] by Onc.   Respiratory status worsening this am, possible aspiration 5/15, begin Zosyn per Pharmacy dosing  Goal of Therapy:  Antibiotic dose/schedule appropriate for treatment, renal function  Plan:   Zosyn 3.375gm IV q8hr- 4 hr infusion  Thank you for the consult  Minda Ditto PharmD Pager [phone] 12/23/2014, 10:48 AM

## 2014-12-23 NOTE — Progress Notes (Signed)
[place] for lovenox Indication: mural thrombus  No Known Allergies  Patient Measurements: Height: '5\' 8"'$  (172.7 cm) Weight:  (unable to do it. patient was restless) IBW/kg (Calculated) : 68.4 Vital Signs: Temp: 96.5 F (35.8 C) (05/16 0440) Temp Source: Axillary (05/16 0440) BP: 140/82 mmHg (05/16 0440) Pulse Rate: 92 (05/16 0440)  Labs:  Recent Labs  12/21/14 0405 12/22/14 0423 12/22/14 1230 12/23/14 0414 12/23/14 0714  HGB 11.8* 11.2*  --  11.6*  --   HCT 39.0 38.6*  --  39.2  --   PLT 407* 298  --  380  --   CREATININE 0.67 0.60* 0.55*  --  0.51*   Estimated Creatinine Clearance: 84.3 mL/min (by C-G formula based on Cr of 0.51).  Medical History: Past Medical History  Diagnosis Date  . Polio   . GERD (gastroesophageal reflux disease)   . Shortness of breath dyspnea   . Cancer     Lung cancer   Medications:  See med rec  Assessment: 69 y.o M with lung cancer and suspected metastatic disease who was admitted on 5/12 with weakness and shortness of breath. S/p right scapular lesion biopsy on 5/13.  CT abd pelvis on 5/13 showed mural thrombus in the distal abdominal aorta.  To start lovenox for thrombus.  Per Dr. Doyle Askew, will start anticoagulation at 0800 on 5/14 due to recent biopsy.  CBC stable, no sign of bleed  Goal of Therapy:  Anti-Xa level 0.6-1 units/ml 4hrs after LMWH dose given Monitor platelets by anticoagulation protocol: Yes   Plan:  - lovenox 70 mg SQ q12h (begun 5/14 at 0800) - cbc q72h  Minda Ditto PharmD Pager [phone] 12/23/2014, 10:54 AM

## 2014-12-23 NOTE — Consult Note (Signed)
Consultation Note Date: 12/23/2014   Patient Name: Samuel Fritz  DOB: September 26, 1945  MRN: [id number]  Age / Sex: 69 y.o., male   PCP: Fanny Bien, MD Referring Physician: Theodis Blaze, MD  Reason for Consultation: Terminal care  Palliative Care Assessment and Plan Summary of Established Goals of Care and Medical Treatment Preferences   69 yo male actively dying of stage 4 widely metastatic lung cancer newly diagnosed on approximately 12/12/2014.  Palliative Care Discussion Held Today Contacts/Participants in Discussion:  Wife, Son, Brother, Yolanda Bonine and multiple other family members were present in the room. Primary Decision Maker: son  Code Status/Advance Care Planning:  DNR.  Focus is dignity and comfort.  Symptom Management:   Pain:  Low dose dilaudid continuous infusion.  Titrate to patient's comfort.  Dyspnea:  Attempted suctioning without success.  Continue PRN oxygen, pain control.  Agitation:  Pain control and valium 5 mg q 2 hours prn.  Steroid dose decreased.  Medications / Treatments not related to comfort have been discontinued.    Psycho-social/Spiritual:   Support System: extended family present.  Desire for further Chaplaincy support::  Yes.    Prognosis: Patient not expected to survive this hospitalization.  Discharge Planning:  May pass within the next 24 hours.       Chief Complaint/History of Present Illness:   History was largely gathered from the patient's son as the patient was in too much pain to give a history.   69 yo male with a PMH of polio s/p multiple surgeries as a child.  Per his family, Samuel Fritz tended to avoid medical doctors.  He developed significant pain in his right shoulder and saw and orthopedic specialist.    The patient had been  losing weight over the past 4 months.  CT on 5/5 revealed a 7.9 x 6.9 cm lung mass with multiple satellite lesions and prominent mets in the right scapula, T8,T7, and C7 vertebrae.  He declined  rapidly and developed quadriplegia, unable to move himself without he assistance of his son and grandson. He was referred to Hospice and scheduled to see Dr. Julien Nordmann on 5/12.  The patient was transferred from Dr. Worthy Flank office for admission due to severe pain, a wbc of over 40k, and a calcium of 14.3.  On 5/16 Palliative Medicine was consulted for end of life care.  The patient was found to be actively dying, in refractory pain extended family was present at bedside.  Primary Diagnoses  Present on Admission:  . Lung mass . Right shoulder pain . Leukocytosis . Hypercalcemia . Severe protein-calorie malnutrition . Thrombocytosis . Anemia of chronic disease . Cord compression . Acute encephalopathy . Acute respiratory failure with hypoxia    Palliative Review of Systems: The patient is encephalopathic and unable to give a review of systems.  Per family he has minimal intake; is very weak and unable to stand; has been becoming increasingly agitated over a period of days.  I have reviewed the medical record, interviewed the patient and family, and examined the patient. The following aspects are pertinent.  Past Medical History  Diagnosis Date  . Polio   . GERD (gastroesophageal reflux disease)   . Shortness of breath dyspnea   . Cancer     Lung cancer   History   Social History  . Marital Status: Married    Spouse Name: N/A  . Number of Children: N/A  . Years of Education: N/A   Social History Main Topics  .  Smoking status: Former Smoker    Types: Cigarettes  . Smokeless tobacco: Current User    Types: Chew  . Alcohol Use: No  . Drug Use: No  . Sexual Activity: No   Other Topics Concern  . None   Social History Narrative   History reviewed. No pertinent family history. Scheduled Meds: . dexamethasone  2 mg Intravenous 4 times per day  . enoxaparin (LOVENOX) injection  70 mg Subcutaneous Q12H  . haloperidol lactate      . HYDROmorphone      .  ipratropium-albuterol  3 mL Nebulization TID   Continuous Infusions: . dextrose 5 % and 0.9 % NaCl with KCl 40 mEq/L 25 mL/hr at 12/23/14 0625  . HYDROmorphone 0.5 mg/hr (12/23/14 1124)   PRN Meds:.albuterol, diazepam, HYDROmorphone, ondansetron **OR** ondansetron (ZOFRAN) IV Medications Prior to Admission:  Prior to Admission medications   Medication Sig Start Date End Date Taking? Authorizing Provider  ENSURE (ENSURE) Take 1 Can by mouth 2 (two) times daily between meals.   Yes Historical Provider, MD  morphine 10 MG/5ML solution Take 2.5 mg by mouth every 2 (two) hours as needed for severe pain (0.46m-0.5ml q2hrs per family).   Yes Historical Provider, MD   No Known Allergies CBC:    Component Value Date/Time   WBC 46.3* 12/23/2014 0414   WBC 40.7* 12/24/2014 1347   HGB 11.6* 12/23/2014 0414   HGB 11.9* 01/02/2015 1347   HCT 39.2 12/23/2014 0414   HCT 36.9* 01/03/2015 1347   PLT 380 12/23/2014 0414   PLT 499* 12/21/2014 1347   MCV 89.5 12/23/2014 0414   MCV 82.4 12/22/2014 1347   NEUTROABS 31.6* 12/09/2014 2055   NEUTROABS 36.5* 01/05/2015 1347   LYMPHSABS 1.8 12/17/2014 2055   LYMPHSABS 1.4 12/30/2014 1347   MONOABS 2.2* 12/18/2014 2055   MONOABS 2.1* 01/02/2015 1347   EOSABS 0.4 12/18/2014 2055   EOSABS 0.6* 12/23/2014 1347   BASOSABS 0.0 12/22/2014 2055   BASOSABS 0.1 12/08/2014 1347   Comprehensive Metabolic Panel:    Component Value Date/Time   NA 143 12/23/2014 0714   NA 140 12/10/2014 1347   K 3.9 12/23/2014 0714   K 3.8 12/11/2014 1347   CL 104 12/23/2014 0714   CO2 30 12/23/2014 0714   CO2 33* 12/10/2014 1347   BUN 28* 12/23/2014 0714   BUN 22.1 01/01/2015 1347   CREATININE 0.51* 12/23/2014 0714   CREATININE 0.7 01/04/2015 1347   GLUCOSE 153* 12/23/2014 0714   GLUCOSE 105 12/29/2014 1347   CALCIUM 9.9 12/23/2014 0714   CALCIUM 14.3* 12/17/2014 1347   AST 27 12/27/2014 2055   AST 34 12/20/2014 1347   ALT 24 01/07/2015 2055   ALT 23 12/12/2014  1347   ALKPHOS 115 01/03/2015 2055   ALKPHOS 136 12/15/2014 1347   BILITOT 0.7 12/23/2014 2055   BILITOT 0.50 12/08/2014 1347   PROT 7.2 01/01/2015 2055   PROT 7.5 01/03/2015 1347   ALBUMIN 2.7* 12/22/2014 2055   ALBUMIN 2.6* 12/21/2014 1347    Physical Exam: Vital Signs: BP 140/82 mmHg  Pulse 92  Temp(Src) 96.5 F (35.8 C) (Axillary)  Resp 28  Ht '5\' 8"'$  (1.727 m)  Wt 69.899 kg (154 lb 1.6 oz)  BMI 23.44 kg/m2  SpO2 96% SpO2: SpO2: 96 % O2 Device: O2 Device: Nasal Cannula O2 Flow Rate: O2 Flow Rate (L/min): 3 L/min Intake/output summary:  Intake/Output Summary (Last 24 hours) at 12/23/14 1133 Last data filed at 12/23/14 0441  Gross per 24  hour  Intake  61.25 ml  Output    625 ml  Net -563.75 ml   LBM:   Baseline Weight: Weight: 69.854 kg (154 lb)  Exam Findings:  General:  Wd, Thin,pale,  elderly appearing male,  Lying in bed moaning and shifting, being held by his son. Family at bedside.   HEENT:  PER, mucous membranes dry, yet patient is gurgling.  Unable to suction deep secretions. Chest:  Mildly increased work of breathing. Abdomen:  Not distended or tight.   Extremities:  No swelling noted.  Appear symmetrically weak.          Palliative Performance Scale: 10              Additional Data Reviewed: Recent Labs     12/22/14  0423  12/22/14  1230  12/23/14  0414  12/23/14  0714  WBC  45.5*   --   46.3*   --   HGB  11.2*   --   11.6*   --   PLT  298   --   380   --   NA  142  144   --   143  BUN  27*  28*   --   28*  CREATININE  0.60*  0.55*   --   0.51*     Time In: 10:50 am Time Out: 11:40 am Time Total: 50 minutes.  Greater than 70%  of this time was spent counseling and coordinating care related to the above assessment and plan.  Signed by:  Melton Alar, PA-C  12/23/2014, 11:33 AM  Please contact Palliative Medicine Team phone at [phone] for questions and concerns.

## 2014-12-23 NOTE — Progress Notes (Signed)
RN called into room by patient's grandson with report that patient was chocking. RN walked in and observed patient sitting up and coughing.  Patient was able to expel what appeared to be a piece of macaroni  and cheese.  RN suctioned pt with [place] but was unable to get anything else from patients throat or mouth.  Patient then observed to be uncomfortable shifting in the bed and indicating to his back saying it hurt. RN notified MD for additional medication support. Will continue to monitor.

## 2014-12-23 NOTE — Progress Notes (Signed)
   12/23/14 1400  Clinical Encounter Type  Visited With Patient and family together  Visit Type Patient actively dying;Spiritual support;Psychological support;Initial  Referral From Nurse;Physician  Consult/Referral To Nurse;Chaplain  Recommendations will follow for continued support around anticipatory grief / loss  Spiritual Encounters  Spiritual Needs Grief support;Emotional;Prayer  Stress Factors  Family Stress Factors Major life changes;Other (Comment) (grief / loss)    Chaplain referred as pt actively dying.  Provided anticipatory grief support / prayers at bedside.  Engaged in life review with family, creating space for points of meaning in pt's life and what family hopes for as pt is dying.  Shared prayers at bedside.  Will continue to follow and refer to night coverage for support this evening.    Samuel Fritz, Samuel Fritz

## 2014-12-24 ENCOUNTER — Encounter: Payer: Self-pay | Admitting: Radiation Oncology

## 2014-12-24 MED ORDER — DEXAMETHASONE SODIUM PHOSPHATE 4 MG/ML IJ SOLN
2.0000 mg | Freq: Two times a day (BID) | INTRAMUSCULAR | Status: DC
  Administered 2014-12-24 – 2014-12-25 (×2): 2 mg via INTRAVENOUS
  Filled 2014-12-24 (×3): qty 0.5

## 2014-12-24 MED ORDER — ATROPINE SULFATE 1 % OP SOLN
2.0000 [drp] | OPHTHALMIC | Status: DC | PRN
  Administered 2014-12-24 – 2014-12-25 (×2): 2 [drp] via SUBLINGUAL
  Filled 2014-12-24: qty 2

## 2014-12-24 MED ORDER — HYDROMORPHONE BOLUS VIA INFUSION
2.0000 mg | INTRAVENOUS | Status: DC | PRN
  Filled 2014-12-24: qty 2

## 2014-12-24 MED ORDER — GLYCOPYRROLATE 0.2 MG/ML IJ SOLN
0.2000 mg | Freq: Two times a day (BID) | INTRAMUSCULAR | Status: DC
  Administered 2014-12-24 – 2014-12-25 (×2): 0.2 mg via INTRAVENOUS
  Filled 2014-12-24 (×3): qty 1

## 2014-12-24 MED ORDER — CHLORHEXIDINE GLUCONATE 0.12 % MT SOLN
15.0000 mL | Freq: Two times a day (BID) | OROMUCOSAL | Status: DC
  Administered 2014-12-24 – 2014-12-25 (×3): 15 mL via OROMUCOSAL
  Filled 2014-12-24 (×6): qty 15

## 2014-12-24 MED ORDER — CETYLPYRIDINIUM CHLORIDE 0.05 % MT LIQD
7.0000 mL | Freq: Two times a day (BID) | OROMUCOSAL | Status: DC
  Administered 2014-12-24: 7 mL via OROMUCOSAL

## 2014-12-24 MED ORDER — ATROPINE SULFATE 1 % OP SOLN
2.0000 [drp] | OPHTHALMIC | Status: DC
  Filled 2014-12-24: qty 2

## 2014-12-24 MED ORDER — HALOPERIDOL LACTATE 5 MG/ML IJ SOLN: 2.0000 mg | Freq: Four times a day (QID) | INTRAMUSCULAR | Status: DC | PRN

## 2014-12-24 NOTE — Progress Notes (Signed)
Nutrition Brief Note  Chart reviewed. Pt now transitioning to comfort care.  No further nutrition interventions warranted at this time.  Please consult as needed.   Clayton Bibles, MS, RD, LDN Pager: [phone] After Hours Pager: [phone]

## 2014-12-24 NOTE — Progress Notes (Signed)
I emailed fmla forms for dale(spouse) of patient to meredith and angela in radiation to process

## 2014-12-24 NOTE — Progress Notes (Signed)
Patient ID: Samuel Fritz, male   DOB: October 12, 1945, 69 y.o.   MRN: [id number]  [facility] PROGRESS NOTE  MERRIC YOST NAT:[id number] DOB: [birth year] DOA: 12/20/2014 PCP: Rachell Cipro, MD   Brief narrative:    Patient is 69 year old male who has not seen a physician for extended period of time, apparently diagnosed with lung mass week prior to this admission and referred to Dr. Julien Nordmann for further evaluation. Please note that patient is too weak to provide any history at the time of the admission and wife at bedside able to assist with more details. She explains that patient had an appointment with Dr. Julien Nordmann earlier today prior to this admission where he was noted to be severely weak, had difficulty completing sentences, dyspnea at rest and with exertion noted with oxygen saturation in mid 80s on room air. Blood work was notable for WBC 40 K, calcium 14.3. Dr. Julien Nordmann asked to admit patient for further evaluation and management. Per wife, patient has been progressively declining over the past 4 months with > 20 pounds weight loss. Wife has also noted intermittent episodes of confusion, lethargy, poor oral intake. Patient has been consistently complaining to his wife about right shoulder pain, appeared to be constant, 10 out of 10 in severity, radiating to upper and lower back area, worse with movement, no specific alleviating factors, associated with right arm weakness and wife reports noticing that patient has not been moving his right upper extremity over the past week. Wife denies noticing any fevers or chills, no specific abdominal or urinary concerns reported. In the oncologist office, urine with blood noted. [facility] asked to admit for further evaluation and management.  Major events since admission: 5/15 - more alert this AM, follows commands appropriately, requiring higher dose of morphine in the afternoon  5/16 - worsening respiratory failure, ? Aspiration, palliative care team  consulted 5/17 - worsening mental status, agonal breathing noted   Assessment/Plan:    Principal Problem:   Acute on chronic respiratory failure with hypoxia in pt with known COPD, lung cancer - worse Am 5/16 and 5/17, worrisome for aspiration - portable CXR requested 5/16 and with no clear acute cardiopulmonary etiology but pt with significant rhonchi on exam with lots of secretions difficult to control  - patient was given one dose of Lasix 40 mg IV on 12/23/2014 with no significant improvement in symptoms - d/w family at bedside, pt's prognosis is guarded and he is requiring more analgesia for symptom control - appreciate palliative care team input and assistance, family wants patient to be comfortable - there is a possibility of underlying PE but pt currently unable to proceed with further testing - Lovenox started per pharmacy for mural thrombus noted in distal abd aorta the family was to have this discontinued to avoid further agitation - Keep on oxygen via nasal cannula for now, provide bronchodilators scheduled and as needed   Weakness - Appears to be multifactorial and secondary to progressive failure to thrive and deconditioning in the setting of what appears to be metastatic malignancy - pt more somnolent and agitated this AM, not following any commands - continue to provide supportive care with analgesia as needed to ensure comfort  Active Problems:  Lung mass, secondary to ? 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 Lisbeth Renshaw assistance greatly, due to significant decline over the past 48-72 hours, patient unable to tolerate simulation, plan for radiation therapy now on hold - Provide analgesia as needed  ? Sepsis on admission  - VS mostly secondary to acute respiratory failure in the setting of metastatic lung cancer rather than an infectious source  - however, May 15th, 2016, pt with T 96.6 F and WBC up to 45.5, started on rocephin for presumptive UTI - pt started on Rocephin on admission due to concern of UTI, urine culture with no growth  - patient has completed 5 days of Rocephin, started on Zosyn 12/23/2014 to broaden the coverage to cover for possible aspiration given worsening respiratory status and mental status in 12/23/2014 - please note that Zosyn has been discontinued in an effort to ensure full comfort  Hypercalcemia - Likely secondary to malignancy, IV Zometa requested as recommended by oncologist on admission - Ca is trending down and was within normal limits 12/23/2014 - no further blood tests to be done in an effort to ensure full comfort  Severe protein-calorie malnutrition - In the setting of illness outlined above - Significant weight loss also related to progression of the metastatic lung cancer - high risk for aspiration, it is very likely pt already aspirated - allow comfort feeding as patient able to tolerate  High risk for Cord compression - Significant weakness in right upper extremity, recent CT chest on 12/12/2014 noted worrisome possibility of cord compression given metastatic spread - pt unable to tolerate MRI  - Radiation  oncologist consulted, patient currently on IV Decadron, family okay to continue this for now   Hypokalemia - supplemented   Hypernatremia, acute - secondary to pre renal etiology - crackles on exam preclude use of more aggressive IVF hydration - IV fluids have been discontinued  Acute encephalopathy, worse am of 5/16 and 5/17 - Possibly secondary to metastatic spread of lung cancer, hypercalcemia, dehydration - MRI of the brain  motion degraded, no clear evidence of tumor spread but MRI brain w/c recommended, please note that pt is unable to tolerate the exam due progressive decline and agitation, restlessness  - Biopsy of the right scapular lesion confirms metastatic squamous cell carcinoma - No plans for chemotherapy or radiation therapy per oncologist and radiation oncologist respectively   Acute functional quadriplegia - in the setting of advanced malignancy with metastatic spread in pt with underlying polio  - unable to get out of the bed, needs two people assistance at minimum  - more agitated this AM, unable to follow commands, requiring more sedation   Thrombocytosis - Appears to be reactive, remains stable overall   Anemia of chronic disease, IDA and malignancy  - iron level 23 - Anemia of malignancy, no signs of active bleeding  DVT prophylaxis - discontinue anticoagulation in an effort to keep patient comfortable  Code Status: DNR Family Communication: Pt and family at bedside, d/w worsening status this  am, very guarded prognosis, family agrees with palliative care consultation  Disposition Plan: Worsening status this am, unable to plan d/c at this point   IV access:  Peripheral IV  Procedures and diagnostic studies:    CXR 01/03/2015  Stable findings of recently diagnosed metastatic lung cancer. 2. No acute superimposed process   Ct Chest W Contrast   12/12/2014   1. Large centrally necrotic left upper lobe lung mass with multiple bilateral pulmonary nodules and osseous  metastases consistent with stage IV lung cancer. 2. Most prominent osseous metastases are within the right scapular body and T8 vertebral body. The latter is associated with epidural tumor, placing the patient at risk for cord compression in future, not demonstrated.  Ct Abdomen Pelvis W Contrast  12/20/2014  1. No evidence of metastatic disease within abdomen or pelvis. 2. Again noted lytic lesion replacing T8 vertebral body. 3. Extensive atherosclerotic plaques and calcifications within abdominal aorta and bilateral common iliac arteries. Mural thrombus is noted in distal abdominal aorta. 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 4. Abundant stool within cecum. No pericecal inflammation. Normal appendix. 5. No small bowel or colonic obstruction. 6. No hydronephrosis or hydroureter. There is a cyst in lower pole anterior aspect of the left kidney measures 2.7 cm. 7. Degenerative changes lumbar spine and bilateral SI joints.     Ct Biopsy  12/20/2014  Technically successful ultrasound and CT guided core needle biopsy of infiltrative right scapular mass. Note, approximately 50 cc of bloody fluid was aspirated from the mass. All aspirated fluid was capped and sent to the laboratory for cytologic analysis.     Mr Brain Wo Contrast  12/20/2014  Moderately motion degraded examination. Three subcentimeter foci of susceptibility artifact in the cerebrum without edema, unlikely to reflect acute hemorrhagic metastasis though, consider follow-up MRI of the brain with contrast when patient is better able to tolerate further imaging. Additional consideration sequelae of hypertension, remote head injury.  Involutional changes. At least mild white matter changes can be seen with chronic small vessel ischemic disease.  Poor visualization of LEFT vertebral artery may reflect artifact, slow flow or, less likely  occlusion. As patient had difficulty tolerating MR, consider followup CT angiogram of the head as clinically indicated.    Medical Consultants:  Interventional radiology Radiation oncology Oncology  PCT  Other Consultants:  None  IAnti-Infectives:   Rocephin 5/12 --> 5/16 Zosyn 5/16 --> 5/16  Faye Ramsay, MD  [facility] Pager [phone]  If 7PM-7AM, please contact night-coverage [email or web address] Password [email or web address] 12/24/2014, 7:09 AM   LOS: 5 days   HPI/Subjective: Significant discomfort and dyspnea this AM. Agitation, restlessness.   Objective: Filed Vitals:   12/22/14 2103 12/23/14 0206 12/23/14 0440 12/23/14 0809  BP: 142/81  140/82   Pulse:   92   Temp: 97.6 F (36.4 C)  96.5 F (35.8 C)   TempSrc: Axillary  Axillary   Resp: 16  28   Height:      Weight:      SpO2: 97% 96% 96% 96%    Intake/Output Summary (Last 24 hours) at 12/24/14 0709 Last data filed at 12/23/14 1741  Gross per 24 hour  Intake  194.1 ml  Output    900 ml  Net -705.9 ml    Exam:   General:  Pt is somnolent and in moderate distress due to dyspnea, restless and agitated   Cardiovascular: Regular rhythm,tachycardic,  no rubs, no gallops  Respiratory: bilateral rhonchi, lots of secretions, respiratory rate 25 breaths per minute  Abdomen: Soft, non tender, non distended, bowel sounds present  Neuro: somnolent, moving LE's spontaneously, agitated with examination   Data Reviewed: Basic Metabolic Panel:  Recent Labs Lab 12/29/2014 2055 12/20/14 0325 12/21/14 0405 12/22/14 0423 12/22/14 1230 12/23/14 0714  NA 136 140 147* 142 144 143  K 3.7 3.2* 3.6 3.4* 3.4* 3.9  CL 94* 95* 104 100* 101 104  CO2 36* 34* 32 31 32 30  GLUCOSE 115* 110* 143* 143* 139* 153*  BUN 23* 21* 28* 27* 28* 28*  CREATININE 0.59* 0.60* 0.67 0.60* 0.55* 0.51*  CALCIUM 12.7* 13.0* 11.7* 10.5* 10.2 9.9  MG 1.9  --   --   --   --   --   PHOS 3.0  --   --   --   --   --    Liver Function Tests:  Recent  Labs Lab 12/29/2014 1347 01/04/2015 2055  AST 34 27  ALT 23 24  ALKPHOS 136 115  BILITOT 0.50 0.7  PROT 7.5 7.2  ALBUMIN 2.6* 2.7*   CBC:  Recent Labs Lab 12/26/2014 1347 12/16/2014 2055 12/20/14 0325 12/21/14 0405 12/22/14 0423 12/23/14 0414  WBC 40.7* 36.0* 32.7* 37.3* 45.5* 46.3*  NEUTROABS 36.5* 31.6*  --   --   --   --   HGB 11.9* 11.5* 12.0* 11.8* 11.2* 11.6*  HCT 36.9* 38.3* 38.9* 39.0 38.6* 39.2  MCV 82.4 86.8 86.8 88.2 88.1 89.5  PLT 499* 401* 317 407* 298 380   Recent Results (from the past 240 hour(s))  TECHNOLOGIST REVIEW     Status: None   Collection Time: 01/04/2015  1:47 PM  Result Value Ref Range Status   Technologist Review Occassional Metas and Myelocytes present  Final  Culture, Urine     Status: None   Collection Time: 01/07/2015  4:43 PM  Result Value Ref Range Status   Specimen Description URINE, CLEAN CATCH  Final   Special Requests NONE  Final  Culture, blood (routine x 2)     Status: None (Preliminary result)   Collection Time: 12/28/2014  8:55 PM  Result Value Ref Range Status   Specimen Description BLOOD LEFT ARM  Final   Special Requests AEB 5CC  Final   Culture   Final           BLOOD CULTURE RECEIVED NO GROWTH TO DATE CULTURE WILL BE HELD FOR 5 DAYS BEFORE ISSUING A FINAL NEGATIVE REPORT Performed at [facility]    Report Status PENDING  Incomplete  Culture, blood (routine x 2)     Status: None (Preliminary result)   Collection Time: 12/20/2014  9:03 PM  Result Value Ref Range Status   Specimen Description BLOOD RIGHT ARM  Final   Special Requests BAA 10CC  Final   Culture   Final           BLOOD CULTURE RECEIVED NO GROWTH TO DATE CULTURE WILL BE HELD FOR 5 DAYS BEFORE ISSUING A FINAL NEGATIVE REPORT Performed at [facility]    Report Status PENDING  Incomplete    Scheduled Meds: . antiseptic oral rinse  7 mL Mouth Rinse q12n4p  . chlorhexidine  15 mL Mouth Rinse BID  . dexamethasone  2 mg Intravenous 4 times per day  .  enoxaparin (LOVENOX) injection  70 mg Subcutaneous Q12H  . scopolamine  1 patch Transdermal Q72H   Continuous Infusions: .  dextrose 5 % and 0.9 % NaCl with KCl 40 mEq/L 25 mL/hr at 12/23/14 0625  . HYDROmorphone 1 mg/hr (12/24/14 0225)

## 2014-12-24 NOTE — Progress Notes (Signed)
Chaplain visited patient and family together. Patient was not settled and family trying to settle him became anxious. Chaplain went to get the nurse and told the family about our services. Chaplain provided a presence and support for the family. Chaplain will follow up with family.   12/24/14 1200  Clinical Encounter Type  Visited With Family;Patient and family together  Visit Type Initial;Spiritual support;Social support  Referral From [employer]

## 2014-12-25 ENCOUNTER — Institutional Professional Consult (permissible substitution): Payer: Self-pay | Admitting: Radiation Oncology

## 2014-12-25 DIAGNOSIS — C3490 Malignant neoplasm of unspecified part of unspecified bronchus or lung: Secondary | ICD-10-CM

## 2014-12-25 DIAGNOSIS — C799 Secondary malignant neoplasm of unspecified site: Secondary | ICD-10-CM

## 2014-12-26 LAB — CULTURE, BLOOD (ROUTINE X 2)
CULTURE: NO GROWTH
CULTURE: NO GROWTH

## 2015-01-08 NOTE — Progress Notes (Signed)
Patient passed away at 1135,this nurse and Kirkland Hun confirmed death of the patient. No heartbeat,no breathing,this nurse witnessed his last breath. Wife at bedside. Dr. Algis Liming notified. I made the organ referral at around 1150. Family members in the room at this time.

## 2015-01-08 NOTE — Progress Notes (Signed)
Dr.Golding called this Probation officer and ordered to increase Dialudid drip to 2.'5mg'$ /hr per family's request. Patient is comfortable,obtunded with periods of apnea, family at bedside.

## 2015-01-08 NOTE — Discharge Summary (Addendum)
Death Summary  Samuel Fritz RQS:[id number] DOB: 12-31-1945 DOA: January 12, 2015  PCP: Rachell Cipro, MD PCP/Office notified: Dr. Kyung Rudd, Radiation Oncology.   Admit date: 01/12/15 Date of Death: 2015-01-18  Final Diagnoses:  Principal Problem:   Weakness Active Problems:   Lung mass   Right shoulder pain   Leukocytosis   Hypercalcemia   Severe protein-calorie malnutrition   Thrombocytosis   Anemia of chronic disease   Cord compression   Acute encephalopathy   Acute respiratory failure with hypoxia   Osseous metastasis  Patient was a 69 year old male who had not seen medical doctors for a while. He developed significant right shoulder pain and saw an orthopedic M.D. Also lost weight over the past 4 months. CT on 5/5 revealed a lung mass with multiple satellite lesions and prominent metastatic lesions in the right scapula, T8, T7 and C7 vertebrae. He rapidly declined and developed quadriplegia. He was seen by the medical oncologist at the cancer center on 01/12/23 who indicated that his presentation was highly suspicious for metastatic lung cancer. He was sent to the hospital for admission and evaluation of significant leukocytosis concerning for sepsis as well as hypercalcemia of malignancy. He had dyspnea, hypoxia on admission. He was treated for acute on chronic respiratory failure with hypoxia due to COPD, lung cancer and concern for aspiration. He also received treatment for presumed sepsis, hypercalcemia, cord compression and acute encephalopathy. He however rapidly deteriorated area. Palliative care was consulted and his care was transitioned to comfort care. 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 This morning, patient was examined and appeared quite comfortable without pain or respiratory distress. He did have agonal breathing. Discussed at length with patient's spouse and extended family at bedside and comforted  and answered questions. Patient subsequently demised peacefully at 11:35 AM on 01/18/2015     Time: Less than 30 minutes  Signed:  Trapper Meech  [facility] 18-Jan-2015, 1:11 PM

## 2015-01-08 NOTE — Progress Notes (Signed)
Wasted in the sink- 10 cc of Dilaudid drip, witnessed by Kirkland Hun RN.

## 2015-01-08 NOTE — Progress Notes (Signed)
Family requested to speak with Dr. Hilma Favors, called her office and I spoke to Laura,she said Md is not in the office at that time I called but will surely relay the message to Md. I went back and notified family about this matter.

## 2015-01-08 DEATH — deceased

## 2016-12-10 IMAGING — CT CT CHEST W/ CM
2 of 3 series · 15 of 30 positions shown, 17 images · IV contrast (75CC ISOVUE 300)
Comparison: Chest radiographs 12/06/2014.

CLINICAL DATA: Former smoker with left lung mass on radiographs no
history of malignancy. Initial encounter.

EXAM:
CT CHEST WITH CONTRAST
TECHNIQUE: Multidetector CT imaging of the chest was performed during
intravenous contrast administration.
CONTRAST:  75 ml [id number].

[Series 3: chest with · axial · 0.70mm/px · z∈[-312,-52]mm · 7 of 70 slices shown, 9 images]
[im 9/70  mediastinal]
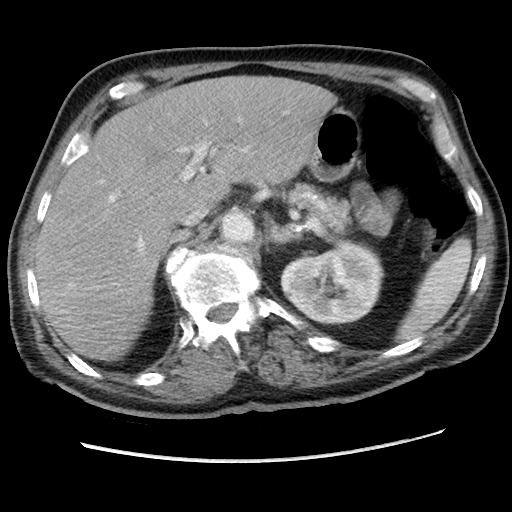
[im 9/70  lung]
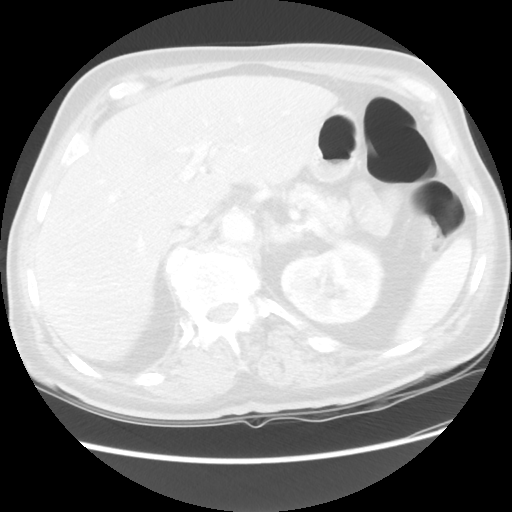
[im 18/70  lung]
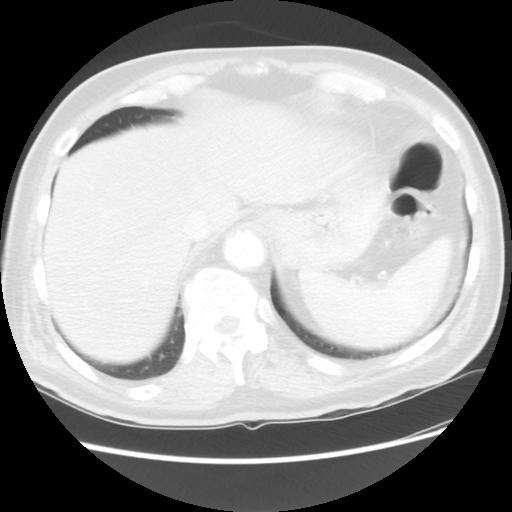
[im 26/70  lung]
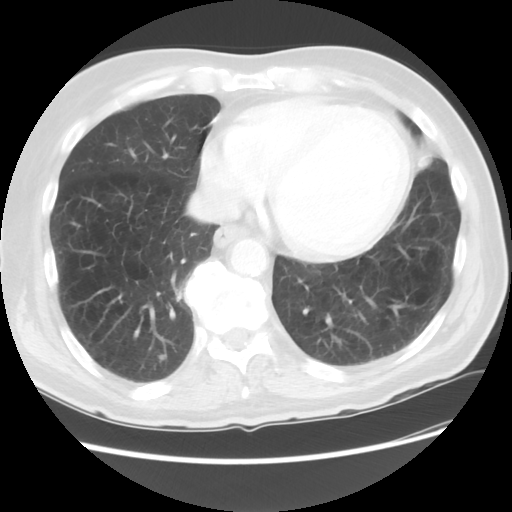
[im 35/70  lung]
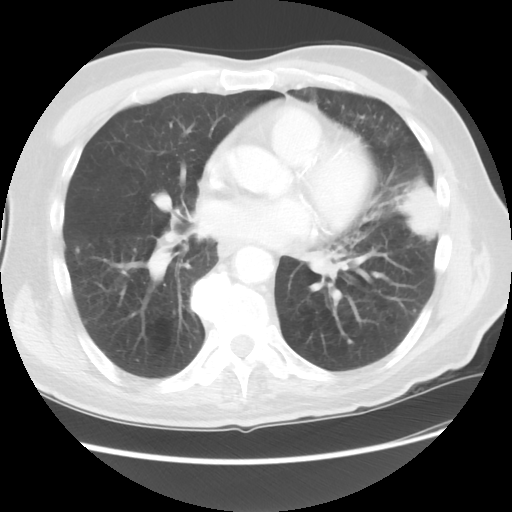
[im 44/70  mediastinal]
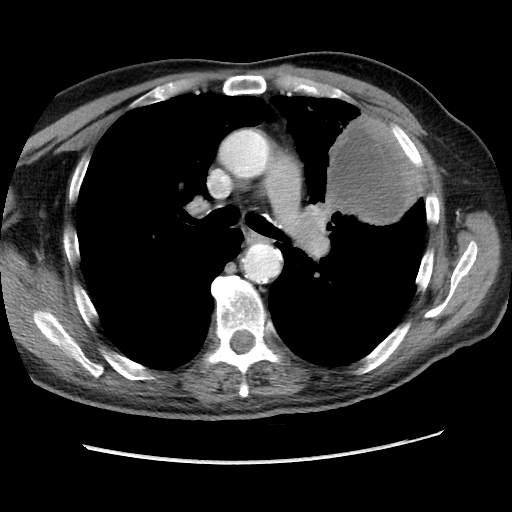
[im 44/70  lung]
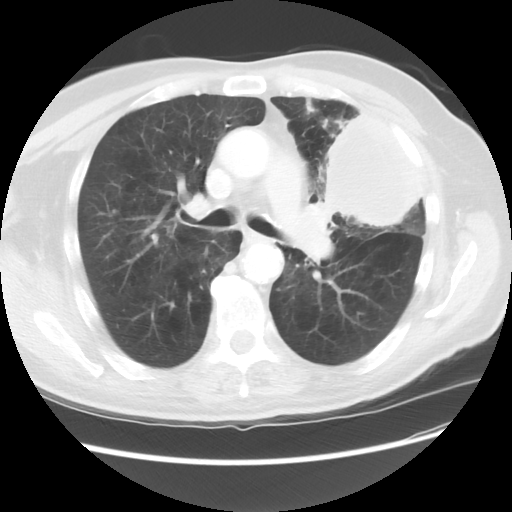
[im 52/70  lung]
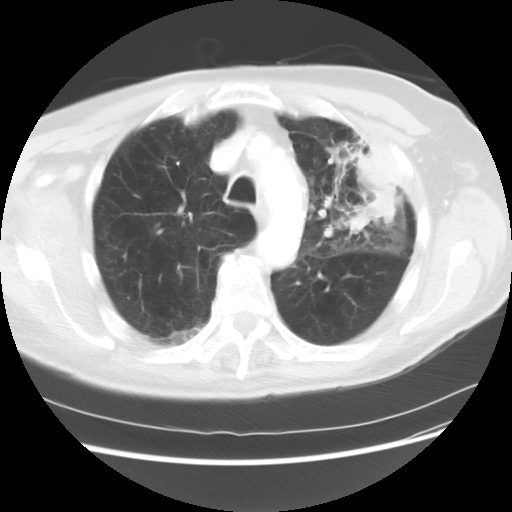
[im 61/70  lung]
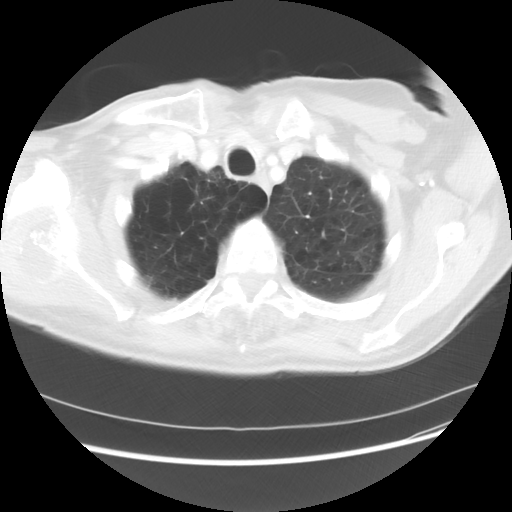

[Series 602: sagittal body · sagittal · 0.70mm/px · 8 of 145 slices shown]
[im 17/145  mediastinal]
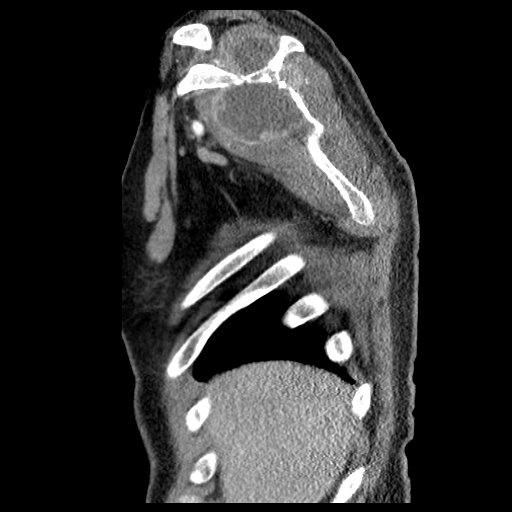
[im 33/145  mediastinal]
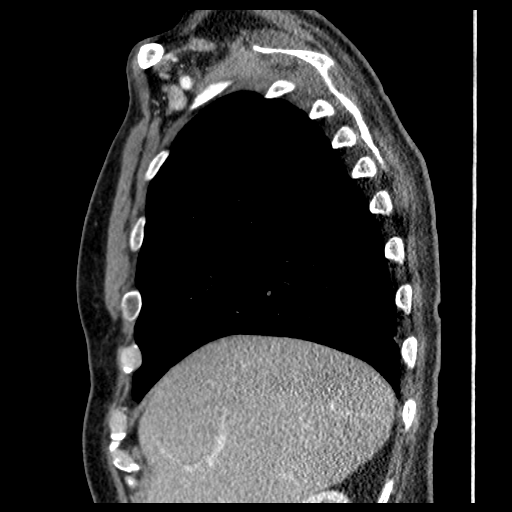
[im 49/145  mediastinal]
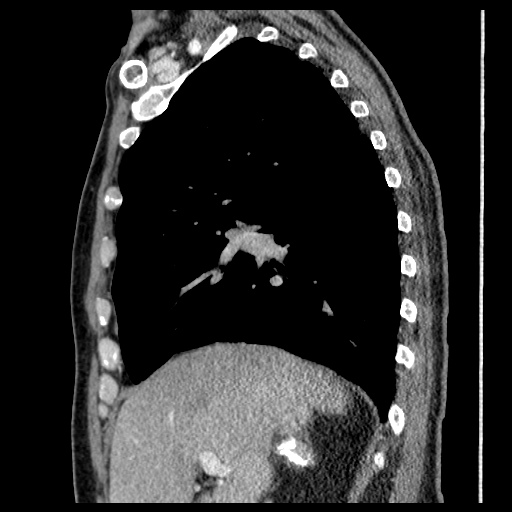
[im 65/145  mediastinal]
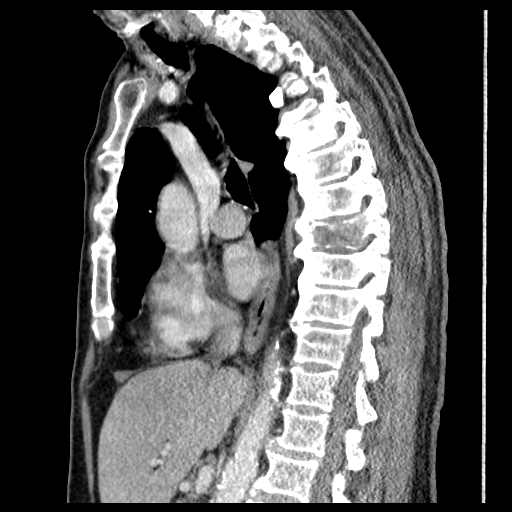
[im 81/145  mediastinal]
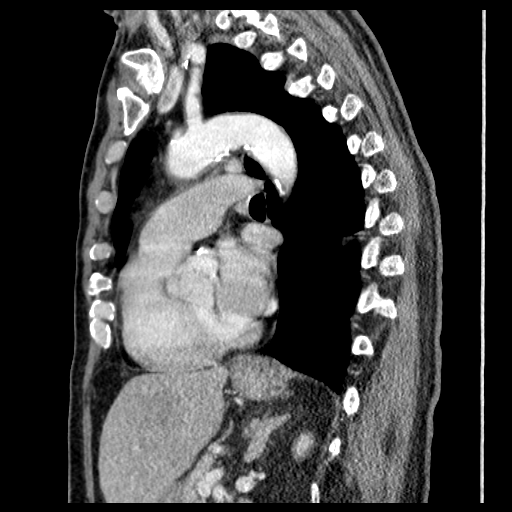
[im 97/145  mediastinal]
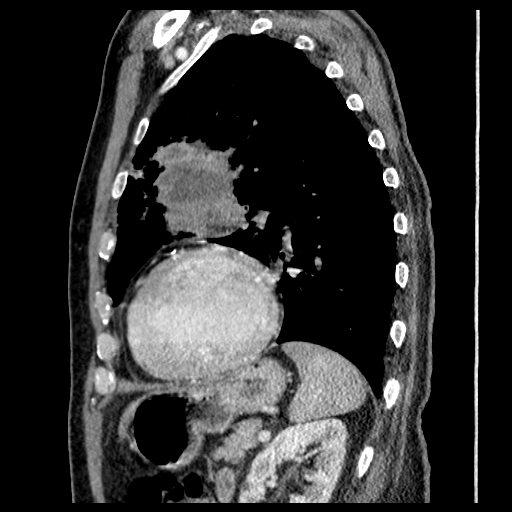
[im 113/145  mediastinal]
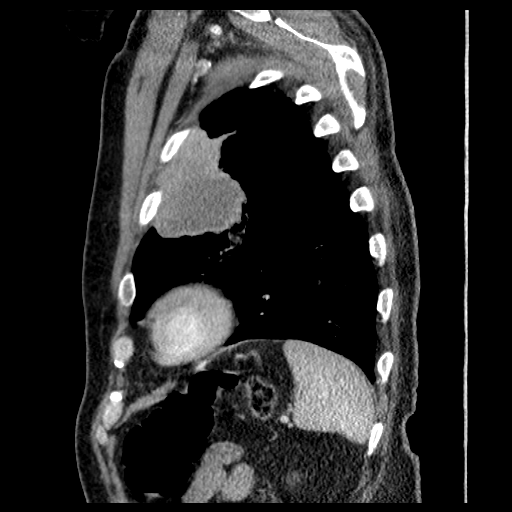
[im 129/145  mediastinal]
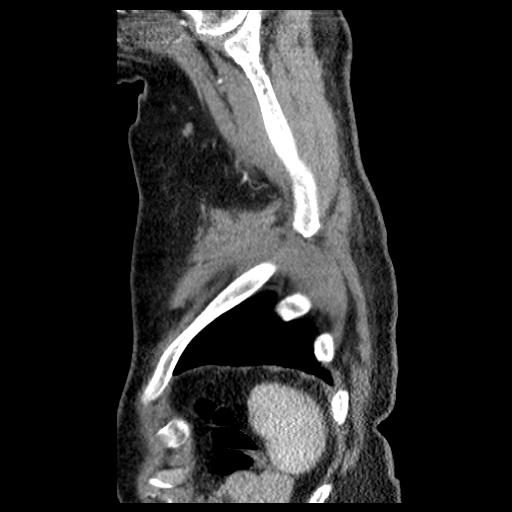

[15 of 30 positions shown; findings below may reference images not displayed]

FINDINGS: Mediastinum/Nodes: There is a 1.5 cm AP window node on image 24.
There are probable enlarged left hilar lymph nodes, not optimally
evaluated due to limited contrast bolus. No other enlarged
mediastinal lymph nodes demonstrated. The thyroid gland, trachea and
esophagus demonstrate no significant findings. The heart size is
normal. There is no pericardial effusion.There is diffuse
atherosclerosis of the aorta, great vessels and coronary arteries.

Lungs/Pleura: There is no pleural effusion. As demonstrated
radiographically, there is a large centrally necrotic left upper
lobe mass, measuring 7.9 x 6.9 cm transverse. There are several
satellite lesions, primarily within the left upper lobe, measuring
2.1 cm on image 24, 1.7 cm on image 29 and 4.2 x 2.8 cm on image 34.
This latter lesion crosses the major fissure and extends into the
lower lobe. There is suspicion of transpleural spread of the
dominant left upper lobe lesion into the chest wall. There is no
adjacent rib destruction. Multiple other smaller pulmonary nodules
are present bilaterally. The largest nodule in the right lung is in
the lower lobe, measuring 8 mm on image 30. Moderate diffuse changes
of centrilobular emphysema noted.

Upper abdomen:  Unremarkable.  There is no adrenal mass.

Musculoskeletal/Chest wall: There is a large lytic lesion involving
the right scapular body, with adjacent soft tissue components
measuring up to 6.5 cm on image 13. There is a large lytic lesion
which nearly completely replaces the T8 vertebral body. There is
mild associated posterior epidural tumor and superior extension into
the inferior aspect of the left T7 vertebral body. There is a
possible small lytic metastasis within the C7 vertebral body.
IMPRESSION: 1. Large centrally necrotic left upper lobe lung mass with multiple
bilateral pulmonary nodules and osseous metastases consistent with
stage IV lung cancer.
2. Most prominent osseous metastases are within the right scapular
body and T8 vertebral body. The latter is associated with epidural
tumor, placing the patient at risk for cord compression in the
future, not currently demonstrated.
3. These results will be called to the ordering clinician or
representative by the [HOSPITAL] at the imaging location.
